# Patient Record
Sex: Male | Born: 1945 | Race: White | Hispanic: No | Smoking: Never smoker
Health system: Southern US, Community
[De-identification: ages and names within clinical notes are randomized; demographics above are authoritative.]

## PROBLEM LIST (undated history)

## (undated) DIAGNOSIS — I10 Essential (primary) hypertension: Secondary | ICD-10-CM

## (undated) DIAGNOSIS — I2510 Atherosclerotic heart disease of native coronary artery without angina pectoris: Secondary | ICD-10-CM

## (undated) DIAGNOSIS — I2119 ST elevation (STEMI) myocardial infarction involving other coronary artery of inferior wall: Secondary | ICD-10-CM

## (undated) DIAGNOSIS — E785 Hyperlipidemia, unspecified: Secondary | ICD-10-CM

## (undated) HISTORY — DX: Hyperlipidemia, unspecified: E78.5

---

## 1994-09-20 HISTORY — PX: CARDIAC SURGERY: SHX584

## 2010-05-03 ENCOUNTER — Emergency Department (HOSPITAL_COMMUNITY): Admission: EM | Admit: 2010-05-03 | Discharge: 2010-05-03 | Payer: Self-pay | Admitting: Emergency Medicine

## 2010-12-04 LAB — ETHANOL: Alcohol, Ethyl (B): 102 mg/dL — ABNORMAL HIGH (ref 0–10)

## 2011-09-29 IMAGING — CT CT MAXILLOFACIAL W/O CM
4 of 9 series · 15 of 47 positions shown, 17 images · non-contrast
Comparison: None.

CT HEAD

CLINICAL DATA: 64-year-old male status post fall, given the pool,
head and face injury with pain.

CT HEAD WITHOUT CONTRAST
CT MAXILLOFACIAL WITHOUT CONTRAST
CT CERVICAL SPINE WITHOUT CONTRAST
TECHNIQUE: Multidetector CT imaging of the head, cervical spine,
and maxillofacial structures were performed using the standard
protocol without intravenous contrast. Multiplanar CT image
reconstructions of the cervical spine and maxillofacial structures
were also generated.

[Series 3: recon 2: brain · axial · 0.49mm/px · 1 of 72 slices shown]
[im 15/72  bone]
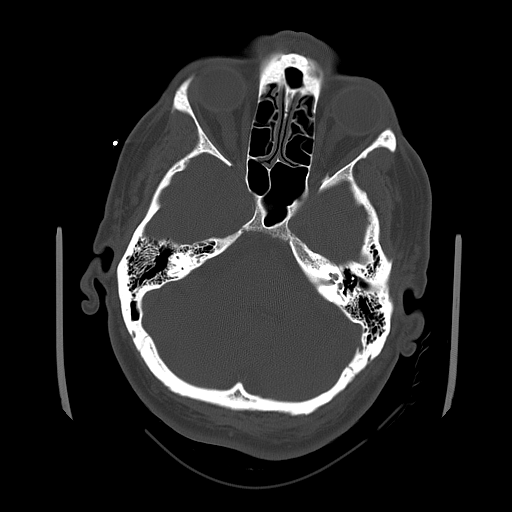

[Series 8: recon 2: cervical spine · axial · 0.35mm/px · z∈[-366,-221]mm · 5 of 88 slices shown]
[im 15/88  bone]
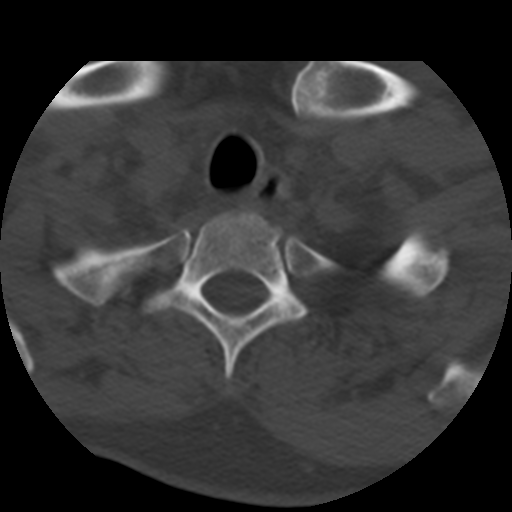
[im 30/88  bone]
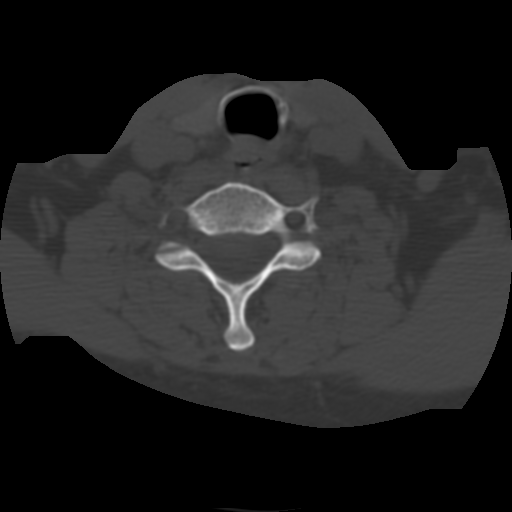
[im 44/88  bone]
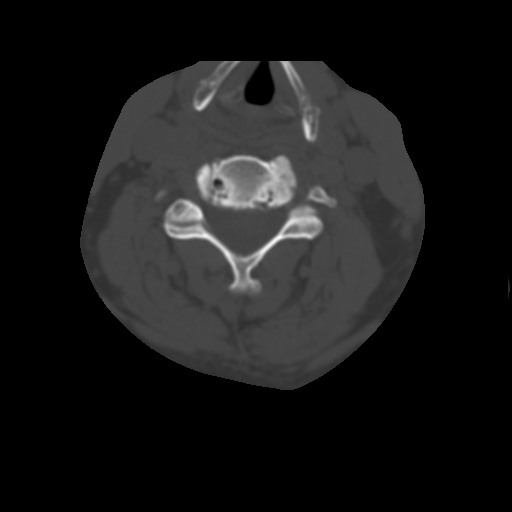
[im 59/88  bone]
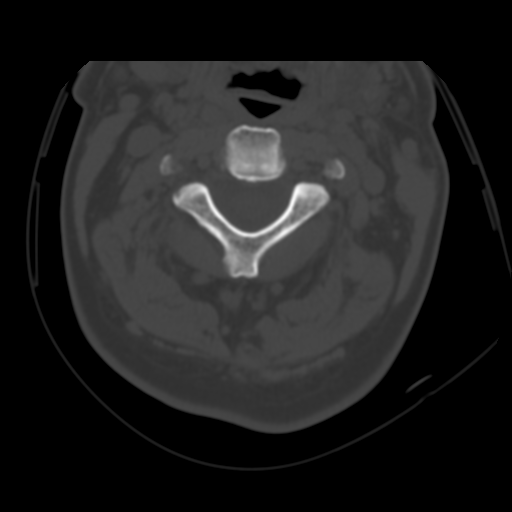
[im 73/88  bone]
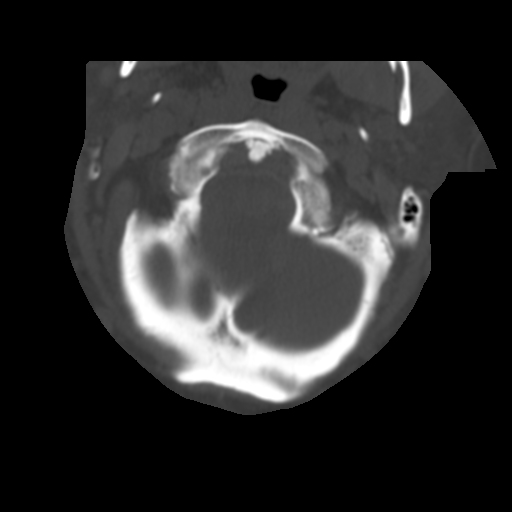

[Series 601: reformatted · sagittal · 0.44mm/px · 3 of 89 slices shown (1 of 2)]
[im 23/89  bone]
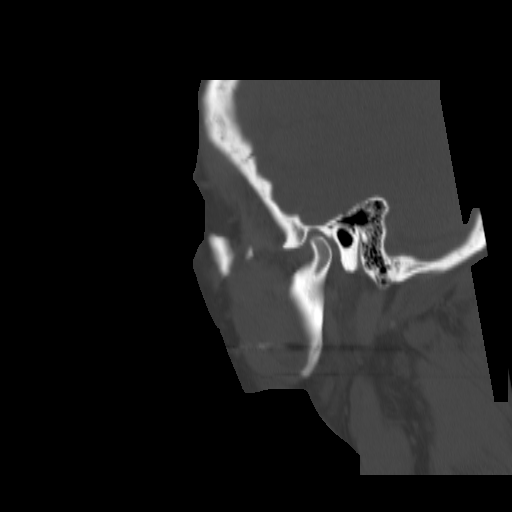
[im 45/89  bone]
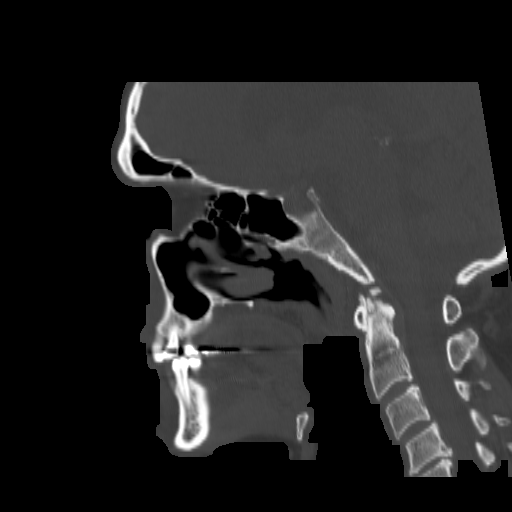
[im 67/89  bone]
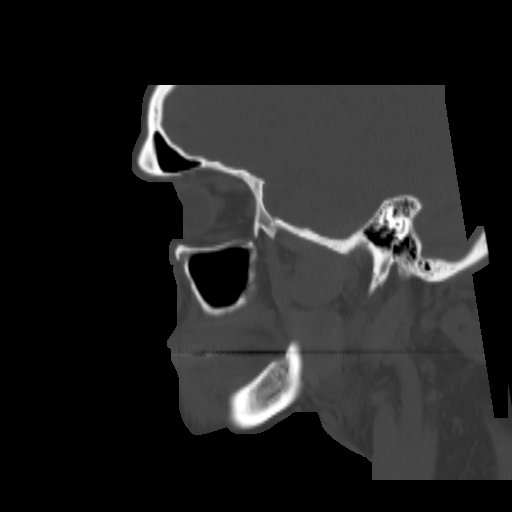

[Series 902: reformatted · axial · 0.44mm/px · z∈[-379,-246]mm · 6 of 100 slices shown, 8 images (2 of 2)]
[im 15/100  brain]
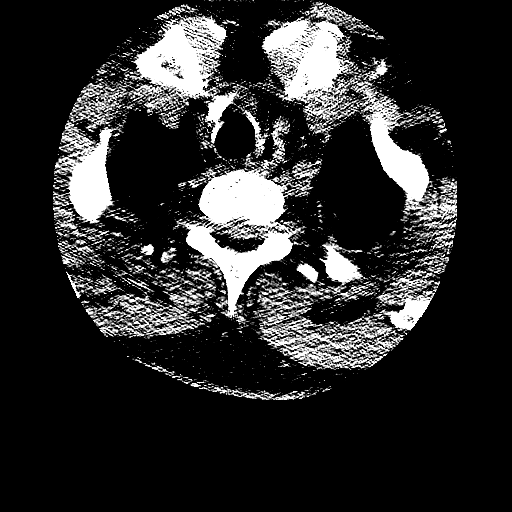
[im 15/100  bone]
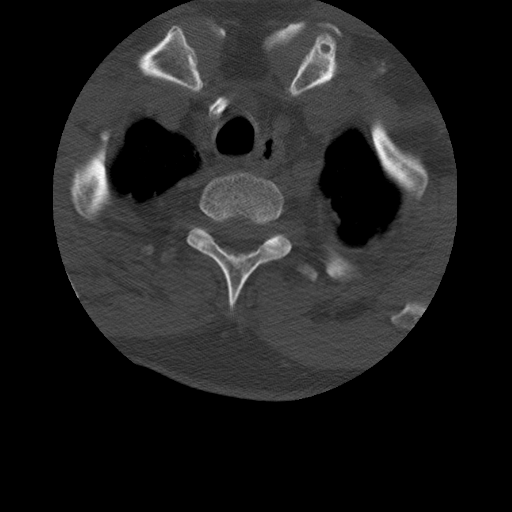
[im 29/100  bone]
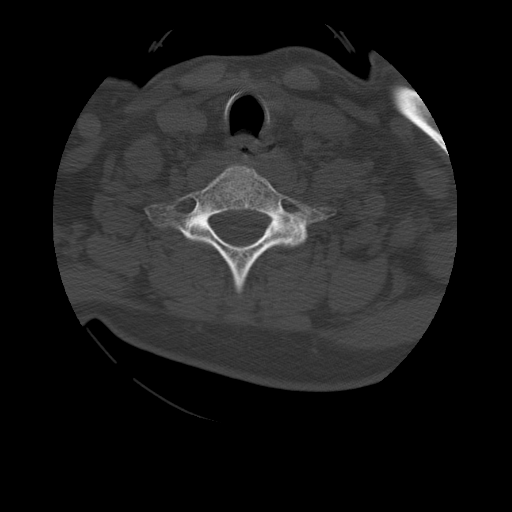
[im 43/100  bone]
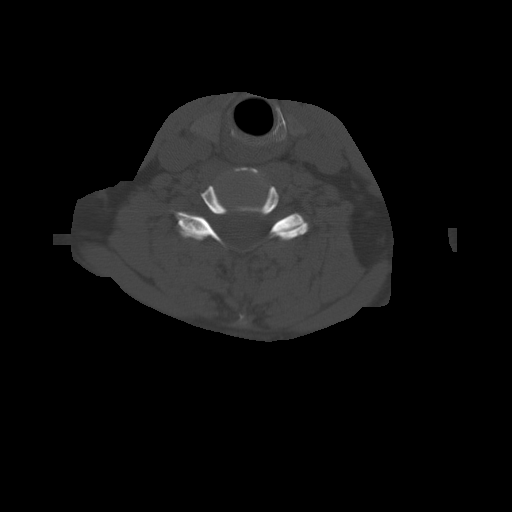
[im 57/100  bone]
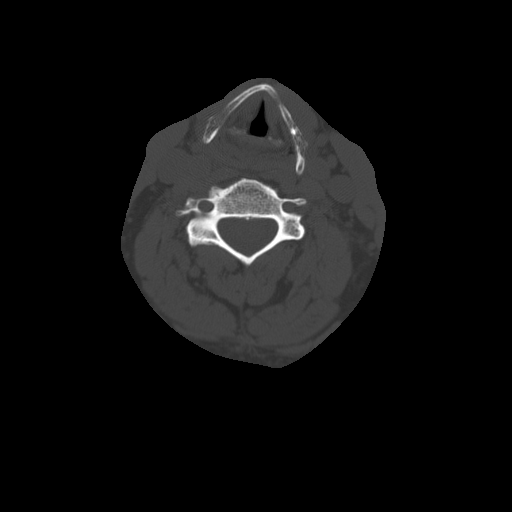
[im 71/100  brain]
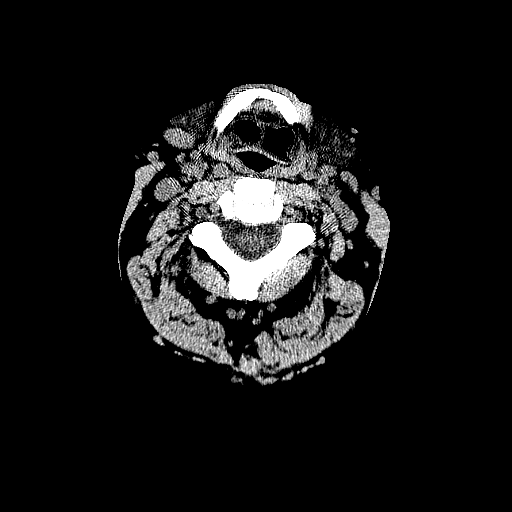
[im 71/100  bone]
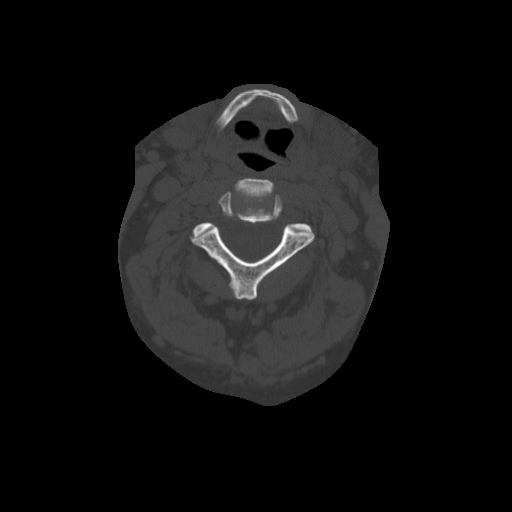
[im 85/100  bone]
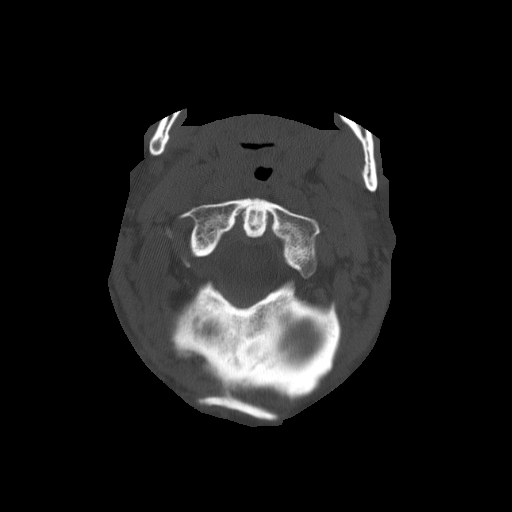

[15 of 47 positions shown; findings below may reference images not displayed]

FINDINGS: Multi focal anterior scalp laceration with trace
subcutaneous gas.  Subcutaneous fat contusion without confluent
scalp hematoma.  Facial findings are below.

Mastoids are clear.  Frontal sinuses are clear.  A small arachnoid
granulations noted along the inner table of the anterior skull.  No
acute to calvarium fracture identified.

Calcified atherosclerosis at the skull base.  Mild ventricular
prominence without overt ventriculomegaly. No midline shift, mass
effect, or evidence of mass lesion.  No acute intracranial
hemorrhage identified.  No evidence of cortically based acute
infarction identified.
IMPRESSION: 1.  Anterior scalp soft tissue injury without underlying calvarium
fracture.  Facial findings are below.
2.  No acute traumatic injury to the brain.

CT MAXILLOFACIAL
FINDINGS: Visualized deep soft tissue spaces of the face and neck
are within normal limits.  Globes and intraorbital soft tissues
appear within normal limits. Depressed comminuted bilateral nasal
bone fractures including 88 more nondisplaced fracture of the
frontal process of the right maxilla.  Rightward deviation of the
nasal septum.  There may be hemorrhage into the cartilaginous
septum (series 4 image 42 (series 5 image 42).  The despite these
findings, paranasal sinuses are clear.  Postoperative changes to
the body of the right mandible.  The mandible appears intact.
Anterior facial and nasal bridge soft tissue contusions and
lacerations.  Degenerative changes in the cervical spine, cervical
findings are described below.
IMPRESSION: 1.  Comminuted and depressed bilateral nasal bone fractures.
Rightward deviation of the nasal septum.  Possible hemorrhage into
the cartilaginous portion of the nasal septum.
2.  No additional acute facial fracture.  Cervical findings are
below.

CT CERVICAL SPINE
FINDINGS: Straightening of cervical lordosis.  Extensive
degenerative changes at the anterior C1-C2 articulation.
Cervicothoracic junction alignment is within normal limits.
Bilateral posterior element alignment is within normal limits.
Trace anterolisthesis of C3 on C4 associated with severe facet
degeneration on the right.  Chronic C4-C5 disc degeneration.
Calcified atherosclerosis of the carotid arteries in the neck.
Visualized lung apices are clear aside from mild scarring.
Visualized skull base is intact.  No atlanto-occipital
dissociation.  No acute cervical fracture.
IMPRESSION: 1. No acute fracture or listhesis identified in the cervical spine.
Ligamentous injury is not excluded.
2.  Multilevel degenerative changes including chronic-appearing
trace spondylolisthesis at C3-C4.

## 2017-07-19 ENCOUNTER — Inpatient Hospital Stay (HOSPITAL_COMMUNITY)
Admission: EM | Admit: 2017-07-19 | Discharge: 2017-07-21 | DRG: 247 | Disposition: A | Discharge status: Home / Self Care | Payer: Medicare PPO | Attending: Cardiovascular Disease | Admitting: Cardiovascular Disease

## 2017-07-19 ENCOUNTER — Encounter (HOSPITAL_COMMUNITY)
Admission: EM | Disposition: A | Discharge status: Home / Self Care | Payer: Self-pay | Source: Home / Self Care | Attending: Cardiovascular Disease

## 2017-07-19 ENCOUNTER — Encounter (HOSPITAL_COMMUNITY): Payer: Self-pay

## 2017-07-19 DIAGNOSIS — I2111 ST elevation (STEMI) myocardial infarction involving right coronary artery: Secondary | ICD-10-CM | POA: Diagnosis present

## 2017-07-19 DIAGNOSIS — R0789 Other chest pain: Secondary | ICD-10-CM | POA: Diagnosis present

## 2017-07-19 DIAGNOSIS — I21A9 Other myocardial infarction type: Secondary | ICD-10-CM | POA: Diagnosis present

## 2017-07-19 DIAGNOSIS — I219 Acute myocardial infarction, unspecified: Secondary | ICD-10-CM | POA: Diagnosis not present

## 2017-07-19 DIAGNOSIS — I10 Essential (primary) hypertension: Secondary | ICD-10-CM | POA: Diagnosis present

## 2017-07-19 DIAGNOSIS — I252 Old myocardial infarction: Secondary | ICD-10-CM

## 2017-07-19 DIAGNOSIS — E785 Hyperlipidemia, unspecified: Secondary | ICD-10-CM | POA: Diagnosis present

## 2017-07-19 DIAGNOSIS — I2119 ST elevation (STEMI) myocardial infarction involving other coronary artery of inferior wall: Secondary | ICD-10-CM | POA: Diagnosis not present

## 2017-07-19 DIAGNOSIS — I213 ST elevation (STEMI) myocardial infarction of unspecified site: Secondary | ICD-10-CM

## 2017-07-19 DIAGNOSIS — T82857A Stenosis of cardiac prosthetic devices, implants and grafts, initial encounter: Secondary | ICD-10-CM | POA: Diagnosis present

## 2017-07-19 DIAGNOSIS — Y831 Surgical operation with implant of artificial internal device as the cause of abnormal reaction of the patient, or of later complication, without mention of misadventure at the time of the procedure: Secondary | ICD-10-CM | POA: Diagnosis present

## 2017-07-19 DIAGNOSIS — I2510 Atherosclerotic heart disease of native coronary artery without angina pectoris: Secondary | ICD-10-CM | POA: Diagnosis not present

## 2017-07-19 DIAGNOSIS — Z955 Presence of coronary angioplasty implant and graft: Secondary | ICD-10-CM

## 2017-07-19 HISTORY — DX: Essential (primary) hypertension: I10

## 2017-07-19 HISTORY — PX: LEFT HEART CATH AND CORONARY ANGIOGRAPHY: CATH118249

## 2017-07-19 HISTORY — PX: CORONARY/GRAFT ACUTE MI REVASCULARIZATION: CATH118305

## 2017-07-19 HISTORY — DX: Atherosclerotic heart disease of native coronary artery without angina pectoris: I25.10

## 2017-07-19 HISTORY — DX: ST elevation (STEMI) myocardial infarction involving other coronary artery of inferior wall: I21.19

## 2017-07-19 LAB — CBC
HEMATOCRIT: 36.7 % — AB (ref 39.0–52.0)
HEMOGLOBIN: 12.5 g/dL — AB (ref 13.0–17.0)
MCH: 32.1 pg (ref 26.0–34.0)
MCHC: 34.1 g/dL (ref 30.0–36.0)
MCV: 94.1 fL (ref 78.0–100.0)
Platelets: 184 10*3/uL (ref 150–400)
RBC: 3.9 MIL/uL — AB (ref 4.22–5.81)
RDW: 12.7 % (ref 11.5–15.5)
WBC: 5.5 10*3/uL (ref 4.0–10.5)

## 2017-07-19 LAB — LIPID PANEL
CHOL/HDL RATIO: 3.6 ratio
CHOLESTEROL: 145 mg/dL (ref 0–200)
HDL: 40 mg/dL — ABNORMAL LOW (ref >40)
LDL Cholesterol: 79 mg/dL (ref 0–99)
Triglycerides: 131 mg/dL (ref <150)
VLDL: 26 mg/dL (ref 0–40)

## 2017-07-19 LAB — COMPREHENSIVE METABOLIC PANEL
ALT: 27 U/L (ref 17–63)
ANION GAP: 10 (ref 5–15)
AST: 22 U/L (ref 15–41)
Albumin: 3.7 g/dL (ref 3.5–5.0)
Alkaline Phosphatase: 52 U/L (ref 38–126)
BILIRUBIN TOTAL: 0.5 mg/dL (ref 0.3–1.2)
BUN: 8 mg/dL (ref 6–20)
CALCIUM: 8.3 mg/dL — AB (ref 8.9–10.3)
CO2: 23 mmol/L (ref 22–32)
Chloride: 104 mmol/L (ref 101–111)
Creatinine, Ser: 0.84 mg/dL (ref 0.61–1.24)
GFR calc Af Amer: >60 mL/min (ref >60)
Glucose, Bld: 99 mg/dL (ref 65–99)
POTASSIUM: 3.1 mmol/L — AB (ref 3.5–5.1)
Sodium: 137 mmol/L (ref 135–145)
TOTAL PROTEIN: 6.2 g/dL — AB (ref 6.5–8.1)

## 2017-07-19 LAB — HEMOGLOBIN A1C
Hgb A1c MFr Bld: 5.3 % (ref 4.8–5.6)
MEAN PLASMA GLUCOSE: 105.41 mg/dL

## 2017-07-19 LAB — APTT: APTT: 123 s — AB (ref 24–36)

## 2017-07-19 LAB — TROPONIN I: TROPONIN I: 0.05 ng/mL — AB (ref <0.03)

## 2017-07-19 LAB — PROTIME-INR
INR: 1.14
Prothrombin Time: 14.5 seconds (ref 11.4–15.2)

## 2017-07-19 SURGERY — LEFT HEART CATH AND CORONARY ANGIOGRAPHY
Anesthesia: LOCAL

## 2017-07-19 MED ORDER — ONDANSETRON HCL 4 MG/2ML IJ SOLN: 4.0000 mg | Freq: Four times a day (QID) | INTRAMUSCULAR | Status: DC | PRN

## 2017-07-19 MED ORDER — LIDOCAINE HCL 2 % IJ SOLN
INTRAMUSCULAR | Status: AC
  Filled 2017-07-19: qty 20

## 2017-07-19 MED ORDER — SODIUM CHLORIDE 0.9% FLUSH: 3.0000 mL | INTRAVENOUS | Status: DC | PRN

## 2017-07-19 MED ORDER — OXYCODONE HCL 5 MG PO TABS: 5.0000 mg | ORAL_TABLET | ORAL | Status: DC | PRN

## 2017-07-19 MED ORDER — TIROFIBAN HCL IN NACL 5-0.9 MG/100ML-% IV SOLN
INTRAVENOUS | Status: AC
  Filled 2017-07-19: qty 100

## 2017-07-19 MED ORDER — HEPARIN SODIUM (PORCINE) 1000 UNIT/ML IJ SOLN
INTRAMUSCULAR | Status: DC | PRN
  Administered 2017-07-19: 2000 [IU] via INTRAVENOUS
  Administered 2017-07-19: 6000 [IU] via INTRAVENOUS

## 2017-07-19 MED ORDER — IOPAMIDOL (ISOVUE-370) INJECTION 76%
INTRAVENOUS | Status: AC
  Filled 2017-07-19: qty 125

## 2017-07-19 MED ORDER — TRAZODONE HCL 50 MG PO TABS: 50.0000 mg | ORAL_TABLET | Freq: Every evening | ORAL | Status: DC | PRN

## 2017-07-19 MED ORDER — ENOXAPARIN SODIUM 40 MG/0.4ML ~~LOC~~ SOLN
40.0000 mg | SUBCUTANEOUS | Status: DC
  Administered 2017-07-20: 40 mg via SUBCUTANEOUS
  Filled 2017-07-19: qty 0.4

## 2017-07-19 MED ORDER — HYDRALAZINE HCL 20 MG/ML IJ SOLN: 5.0000 mg | INTRAMUSCULAR | Status: AC | PRN

## 2017-07-19 MED ORDER — HEPARIN SODIUM (PORCINE) 1000 UNIT/ML IJ SOLN
INTRAMUSCULAR | Status: AC
  Filled 2017-07-19: qty 1

## 2017-07-19 MED ORDER — HEPARIN SODIUM (PORCINE) 5000 UNIT/ML IJ SOLN
INTRAMUSCULAR | Status: AC
  Filled 2017-07-19: qty 1

## 2017-07-19 MED ORDER — METOPROLOL TARTRATE 50 MG PO TABS
50.0000 mg | ORAL_TABLET | Freq: Two times a day (BID) | ORAL | Status: DC
  Administered 2017-07-19 – 2017-07-21 (×4): 50 mg via ORAL
  Filled 2017-07-19 (×4): qty 1

## 2017-07-19 MED ORDER — TICAGRELOR 90 MG PO TABS
ORAL_TABLET | ORAL | Status: DC | PRN
  Administered 2017-07-19: 180 mg via ORAL

## 2017-07-19 MED ORDER — ACETAMINOPHEN 325 MG PO TABS
650.0000 mg | ORAL_TABLET | ORAL | Status: DC | PRN
  Administered 2017-07-20 (×2): 650 mg via ORAL
  Filled 2017-07-19 (×2): qty 2

## 2017-07-19 MED ORDER — HEPARIN (PORCINE) IN NACL 2-0.9 UNIT/ML-% IJ SOLN
INTRAMUSCULAR | Status: AC | PRN
  Administered 2017-07-19: 1000 mL via INTRA_ARTERIAL

## 2017-07-19 MED ORDER — TICAGRELOR 90 MG PO TABS
ORAL_TABLET | ORAL | Status: AC
  Filled 2017-07-19: qty 2

## 2017-07-19 MED ORDER — MIDAZOLAM HCL 2 MG/2ML IJ SOLN
INTRAMUSCULAR | Status: DC | PRN
  Administered 2017-07-19: 2 mg via INTRAVENOUS

## 2017-07-19 MED ORDER — TIROFIBAN HCL IN NACL 5-0.9 MG/100ML-% IV SOLN
INTRAVENOUS | Status: AC | PRN
  Administered 2017-07-19: 0.15 ug/kg/min via INTRAVENOUS

## 2017-07-19 MED ORDER — NITROGLYCERIN 0.4 MG SL SUBL: 0.4000 mg | SUBLINGUAL_TABLET | SUBLINGUAL | Status: DC | PRN

## 2017-07-19 MED ORDER — HEPARIN SODIUM (PORCINE) 5000 UNIT/ML IJ SOLN
4000.0000 [IU] | Freq: Once | INTRAMUSCULAR | Status: AC
  Administered 2017-07-19: 4000 [IU] via INTRAVENOUS

## 2017-07-19 MED ORDER — NITROGLYCERIN 1 MG/10 ML FOR IR/CATH LAB
INTRA_ARTERIAL | Status: DC | PRN
  Administered 2017-07-19: 200 ug via INTRACORONARY

## 2017-07-19 MED ORDER — SERTRALINE HCL 50 MG PO TABS
50.0000 mg | ORAL_TABLET | Freq: Every morning | ORAL | Status: DC
  Administered 2017-07-21: 50 mg via ORAL
  Filled 2017-07-19 (×2): qty 1

## 2017-07-19 MED ORDER — IOPAMIDOL (ISOVUE-370) INJECTION 76%
INTRAVENOUS | Status: DC | PRN
  Administered 2017-07-19: 105 mL via INTRA_ARTERIAL

## 2017-07-19 MED ORDER — HEPARIN (PORCINE) IN NACL 2-0.9 UNIT/ML-% IJ SOLN
INTRAMUSCULAR | Status: AC
  Filled 2017-07-19: qty 1000

## 2017-07-19 MED ORDER — FENTANYL CITRATE (PF) 100 MCG/2ML IJ SOLN
INTRAMUSCULAR | Status: DC | PRN
  Administered 2017-07-19: 25 ug via INTRAVENOUS

## 2017-07-19 MED ORDER — SODIUM CHLORIDE 0.9 % WEIGHT BASED INFUSION: 1.0000 mL/kg/h | INTRAVENOUS | Status: AC

## 2017-07-19 MED ORDER — TICAGRELOR 90 MG PO TABS
90.0000 mg | ORAL_TABLET | Freq: Two times a day (BID) | ORAL | Status: DC
  Administered 2017-07-20 – 2017-07-21 (×3): 90 mg via ORAL
  Filled 2017-07-19 (×3): qty 1

## 2017-07-19 MED ORDER — SODIUM CHLORIDE 0.9 % IV SOLN
INTRAVENOUS | Status: AC | PRN
  Administered 2017-07-19: 100 mL/h via INTRAVENOUS

## 2017-07-19 MED ORDER — LABETALOL HCL 5 MG/ML IV SOLN: 10.0000 mg | INTRAVENOUS | Status: AC | PRN

## 2017-07-19 MED ORDER — MIDAZOLAM HCL 2 MG/2ML IJ SOLN
INTRAMUSCULAR | Status: AC
  Filled 2017-07-19: qty 2

## 2017-07-19 MED ORDER — ROPINIROLE HCL 1 MG PO TABS
0.5000 mg | ORAL_TABLET | Freq: Every day | ORAL | Status: DC
  Administered 2017-07-19 – 2017-07-20 (×2): 0.5 mg via ORAL
  Filled 2017-07-19 (×2): qty 1

## 2017-07-19 MED ORDER — MORPHINE SULFATE (PF) 4 MG/ML IV SOLN: 2.0000 mg | INTRAVENOUS | Status: DC | PRN

## 2017-07-19 MED ORDER — ATORVASTATIN CALCIUM 80 MG PO TABS
80.0000 mg | ORAL_TABLET | Freq: Every day | ORAL | Status: DC
  Administered 2017-07-20: 80 mg via ORAL
  Filled 2017-07-19: qty 1

## 2017-07-19 MED ORDER — FENTANYL CITRATE (PF) 100 MCG/2ML IJ SOLN
INTRAMUSCULAR | Status: AC
  Filled 2017-07-19: qty 2

## 2017-07-19 MED ORDER — LIDOCAINE HCL 2 % IJ SOLN
INTRAMUSCULAR | Status: DC | PRN
  Administered 2017-07-19: 3 mL via INTRADERMAL

## 2017-07-19 MED ORDER — SODIUM CHLORIDE 0.9 % IV SOLN: 250.0000 mL | INTRAVENOUS | Status: DC | PRN

## 2017-07-19 MED ORDER — ASPIRIN EC 81 MG PO TBEC
81.0000 mg | DELAYED_RELEASE_TABLET | Freq: Every day | ORAL | Status: DC
  Administered 2017-07-20 – 2017-07-21 (×2): 81 mg via ORAL
  Filled 2017-07-19 (×2): qty 1

## 2017-07-19 MED ORDER — SODIUM CHLORIDE 0.9% FLUSH
3.0000 mL | Freq: Two times a day (BID) | INTRAVENOUS | Status: DC
  Administered 2017-07-20: 3 mL via INTRAVENOUS

## 2017-07-19 MED ORDER — TIROFIBAN (AGGRASTAT) BOLUS VIA INFUSION
INTRAVENOUS | Status: DC | PRN
  Administered 2017-07-19: 2437.5 ug via INTRAVENOUS

## 2017-07-19 MED ORDER — VERAPAMIL HCL 2.5 MG/ML IV SOLN
INTRAVENOUS | Status: AC
  Filled 2017-07-19: qty 2

## 2017-07-19 MED ORDER — VERAPAMIL HCL 2.5 MG/ML IV SOLN
INTRAVENOUS | Status: DC | PRN
  Administered 2017-07-19: 10 mL via INTRA_ARTERIAL

## 2017-07-19 SURGICAL SUPPLY — 18 items
BALLN EUPHORA RX 2.5X15 (BALLOONS) ×2
BALLN ~~LOC~~ EUPHORA RX 4.5X12 (BALLOONS) ×2
BALLOON EUPHORA RX 2.5X15 (BALLOONS) IMPLANT
BALLOON ~~LOC~~ EUPHORA RX 4.5X12 (BALLOONS) IMPLANT
CATH 5FR JL3.5 JR4 ANG PIG MP (CATHETERS) ×1 IMPLANT
CATH VISTA GUIDE 6FR JR4 (CATHETERS) ×1 IMPLANT
DEVICE RAD COMP TR BAND LRG (VASCULAR PRODUCTS) ×1 IMPLANT
GLIDESHEATH SLEND SS 6F .021 (SHEATH) ×1 IMPLANT
GUIDEWIRE INQWIRE 1.5J.035X260 (WIRE) IMPLANT
INQWIRE 1.5J .035X260CM (WIRE) ×2
KIT ENCORE 26 ADVANTAGE (KITS) ×2 IMPLANT
KIT HEART LEFT (KITS) ×2 IMPLANT
PACK CARDIAC CATHETERIZATION (CUSTOM PROCEDURE TRAY) ×2 IMPLANT
STENT SIERRA 4.00 X 23 MM (Permanent Stent) ×1 IMPLANT
SYR MEDRAD MARK V 150ML (SYRINGE) ×2 IMPLANT
TRANSDUCER W/STOPCOCK (MISCELLANEOUS) ×2 IMPLANT
TUBING CIL FLEX 10 FLL-RA (TUBING) ×2 IMPLANT
WIRE COUGAR XT STRL 190CM (WIRE) ×1 IMPLANT

## 2017-07-19 NOTE — H&P (Signed)
Patient ID: Jason Cabrera MRN: [id number], DOB/AGE: 1946-07-20   Admit date: 07/19/2017   Primary Physician: No primary care provider on file. Primary Cardiologist: None  Pt. Profile: 71 yo male w/ history of CAD (PCI to RCA in 1996) who presents with acute onset of chest pain and STEMI on EKG  Problem List  Past Medical History:  Diagnosis Date  . 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 Coronary artery disease   . Hypertension     Past Surgical History:  Procedure Laterality Date  . CARDIAC SURGERY  1996   stents     Allergies  No Known Allergies  HPI Melton Krebs was in his usual state of health when this evening while sitting and talking with his girlfriend he developed chest pressure/tighthness. It radiated to his left jaw and left shoulder. Denied nausea, vomiting or diaphoresis - something he had with his prior MI.   Denies any tobacco, drugs or ETOH. Does not take his ASA regularly.   Home Medications  Prior to Admission medications   Medication Sig Start Date End Date Taking? Authorizing Provider  aspirin EC 81 MG tablet Take 81 mg by mouth daily.   Yes [provider]  hydrOXYzine (VISTARIL) 25 MG capsule Take 25 mg by mouth daily as needed for anxiety.   Yes [provider]  metoprolol tartrate (LOPRESSOR) 50 MG tablet Take 50 mg by mouth 2 (two) times daily.   Yes [provider]  rOPINIRole (REQUIP) 0.5 MG tablet Take 0.5 mg by mouth at bedtime.   Yes [provider]  sertraline (ZOLOFT) 100 MG tablet Take 50 mg by mouth every morning.   Yes [provider]  tadalafil (CIALIS) 10 MG tablet Take 10 mg by mouth daily as needed for erectile dysfunction.   Yes [provider]  traZODone (DESYREL) 50 MG tablet Take 50 mg by mouth at bedtime as needed for sleep.   Yes [provider]    Family History  No family history on file.  Social History  Social History    Social History  . Marital status: Divorced    Spouse name: N/A  . Number of children: N/A  . Years of education: N/A   Occupational History  . Not on file.   Social History Main Topics  . Smoking status: Never Smoker  . Smokeless tobacco: Never Used  . Alcohol use No  . Drug use: No  . Sexual activity: Not on file   Other Topics Concern  . Not on file   Social History Narrative  . No narrative on file     Review of Systems General:  No chills, fever, night sweats or weight changes.  Cardiovascular:  + chest pain, no dyspnea on exertion, edema, orthopnea, palpitations, paroxysmal nocturnal dyspnea. Dermatological: No rash, lesions/masses Respiratory: No cough, dyspnea Urologic: No hematuria, dysuria Abdominal:   No nausea, vomiting, diarrhea, bright red blood per rectum, melena, or hematemesis Neurologic:  No visual changes, wkns, changes in mental status. All other systems reviewed and are otherwise negative except as noted above.  Physical Exam  Blood pressure (!) 141/73, pulse 75, temperature 98.6 F (37 C), temperature source Oral, resp. rate 15, height 6\' 1"  (1.854 m), weight 97.5 kg (215 lb), SpO2 99 %.  General: Pleasant, NAD Psych: Normal affect. Neuro: Alert and oriented X 3. Moves all extremities spontaneously. HEENT: Normal  Neck: Supple without bruits or JVD. Lungs:  Resp regular and unlabored, CTA. Heart: RRR no  s3, s4, or murmurs. Abdomen: Soft, non-tender, non-distended, BS + x 4.  Extremities: No clubbing, cyanosis or edema. DP/PT/Radials 2+ and equal bilaterally.  Labs  Troponin (Point of Care Test) No results for input(s): TROPIPOC in the last 72 hours.  Recent Labs  07/19/17 2142  TROPONINI 0.05*   Lab Results  Component Value Date   WBC 5.5 07/19/2017   HGB 12.5 (L) 07/19/2017   HCT 36.7 (L) 07/19/2017   MCV 94.1 07/19/2017   PLT 184 07/19/2017    Recent Labs Lab 07/19/17 2142  NA 137  K 3.1*  CL 104  CO2 23  BUN 8   CREATININE 0.84  CALCIUM 8.3*  PROT 6.2*  BILITOT 0.5  ALKPHOS 52  ALT 27  AST 22  GLUCOSE 99   Lab Results  Component Value Date   CHOL 145 07/19/2017   HDL 40 (L) 07/19/2017   LDLCALC 79 07/19/2017   TRIG 131 07/19/2017   No results found for: DDIMER   Radiology/Studies  No results found.  ECG Showed inferior/posterior ST elevation   Echocardiogram  Pending   LHC 1.  Acute inferoposterior MI secondary to critical mid RCA in-stent restenosis/thrombosis, treated successfully with PCI using a 4.0 x 23 mm Sierra DES 2.  Mild diffuse nonobstructive coronary artery disease as detailed above 3.  Mild segmental contraction abnormality the left ventricle with normal LVEDP and preserved LVEF  ASSESSMENT AND PLAN 71 yo male w/ history of CAD (PCI to RCA in 1996) who presents with acute onset of chest pain and inferior STEMI on EKG  # Acute inferoposterior STEMI: underwent LHC, found to have critical mRCA in-stent restenosis/thrombosis with successful PCI w/ DES - Tirofiban x 4 hours - DAPT with ASA and brilinta x 12 months (180 mg loading dose administered in cath lab) - Atorvastatin 80mg , Metop 50mg  BID and aggressive risk reduction measures - TTE - Consider discharge in 48 hours if no complications arise  Signed, Yehuda SavannahJedrek Lenaya Pietsch, MD

## 2017-07-19 NOTE — ED Triage Notes (Signed)
Pt arrived as code stemi via [facility] after 1 hour of sudden onset left chest tightness, left shoulder pain, and left arm numbness. Pt states he felt fatigued since pain began. PT took 6 81mg  ASA pta. 1 sl ntg given pta. Pt a&Ox 4. NAD. Denies n/v/ diaphoresis

## 2017-07-19 NOTE — ED Notes (Signed)
Pt to cathlab 5 at this time

## 2017-07-19 NOTE — ED Notes (Signed)
Pt reports pain increasing to 7/10 at this time. Dr. Corlis Leak aware

## 2017-07-19 NOTE — ED Provider Notes (Signed)
[facility] Provider Note   CSN: [id number] Arrival date & time: 07/19/17  2028     History   Chief Complaint Chief Complaint  Patient presents with  . Code STEMI    HPI Jason Cabrera is a 71 y.o. male.  HPI   Patient is a 71 year old male presenting with chest pain.  Patient had stent placed in 1996.  Patient reports that he was talking to his girlfriend and also developed heaviness in his chest similar to his last STEMI.  Patient took 6 aspirin began to feel a bit better EMS arrived EKG showed ST elevations in 2 3 and aVF.  A called code STEMI.  On arrival EKG changes somewhat better, chest pain has been relieved.  Will start heparin bolus and drip.  Cardiology at bedside.    Past Medical History:  Diagnosis Date  . 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 Coronary artery disease   . Hypertension     Patient Active Problem List   Diagnosis Date Noted  . 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 STEMI involving right coronary artery (HCC) 07/19/2017    Past Surgical History:  Procedure Laterality Date  . CARDIAC SURGERY  1996   stents       Home Medications    Prior to Admission medications   Medication Sig Start Date End Date Taking? Authorizing Provider  aspirin EC 81 MG tablet Take 81 mg by mouth daily.   Yes [provider]  hydrOXYzine (VISTARIL) 25 MG capsule Take 25 mg by mouth daily as needed for anxiety.   Yes [provider]  metoprolol tartrate (LOPRESSOR) 50 MG tablet Take 50 mg by mouth 2 (two) times daily.   Yes [provider]  rOPINIRole (REQUIP) 0.5 MG tablet Take 0.5 mg by mouth at bedtime.   Yes [provider]  sertraline (ZOLOFT) 100 MG tablet Take 50 mg by mouth every morning.   Yes [provider]  tadalafil (CIALIS) 10 MG tablet Take 10 mg by mouth daily as needed for erectile dysfunction.   Yes [provider]    traZODone (DESYREL) 50 MG tablet Take 50 mg by mouth at bedtime as needed for sleep.   Yes [provider]    Family History No family history on file.  Social History Social History  Substance Use Topics  . Smoking status: Never Smoker  . Smokeless tobacco: Never Used  . Alcohol use No     Allergies   Patient has no known allergies.   Review of Systems Review of Systems  Constitutional: Negative for activity change.  Respiratory: Positive for shortness of breath.   Cardiovascular: Positive for chest pain.  Gastrointestinal: Negative for abdominal pain.  All other systems reviewed and are negative.    Physical Exam Updated Vital Signs BP (!) 141/73   Pulse 75   Temp 98.6 F (37 C) (Oral)   Resp 15   Ht 6\' 1"  (1.854 m)   Wt 97.5 kg (215 lb)   SpO2 99%   BMI 28.37 kg/m   Physical Exam  Constitutional: He is oriented to person, place, and time. He appears well-nourished.  HENT:  Head: Normocephalic.  Eyes: Conjunctivae are normal.  Cardiovascular: Normal rate and regular rhythm.   No murmur heard. Pulmonary/Chest: Effort normal and breath sounds normal. No respiratory distress.  Abdominal: Soft. He exhibits no distension. There is no tenderness.  Neurological: He is oriented to person, place, and time.  Skin: Skin is warm and dry. He is not diaphoretic.  Psychiatric: He has a normal mood and affect. His behavior is normal.     ED Treatments / Results  Labs (all labs ordered are listed, but only abnormal results are displayed) Labs Reviewed  COMPREHENSIVE METABOLIC PANEL - Abnormal; Notable for the following:       Result Value   Potassium 3.1 (*)    Calcium 8.3 (*)    Total Protein 6.2 (*)    All other components within normal limits  LIPID PANEL - Abnormal; Notable for the following:    HDL 40 (*)    All other components within normal limits  CBC - Abnormal; Notable for the following:    RBC 3.90 (*)    Hemoglobin 12.5 (*)    HCT 36.7  (*)    All other components within normal limits  APTT - Abnormal; Notable for the following:    aPTT 123 (*)    All other components within normal limits  TROPONIN I - Abnormal; Notable for the following:    Troponin I 0.05 (*)    All other components within normal limits  MRSA PCR SCREENING  HEMOGLOBIN A1C  PROTIME-INR  BASIC METABOLIC PANEL  CBC  LIPID PANEL    EKG  EKG Interpretation None       EKG from EMS showed greater than 3 mm ST elevation in 2 3 aVF.  EKG on arrival here shows resolution of that.  Radiology No results found.  Procedures Procedures (including critical care time)  CRITICAL CARE Performed by: Arlana Hove Total critical care time: 30 minutes Critical care time was exclusive of separately billable procedures and treating other patients. Critical care was necessary to treat or prevent imminent or life-threatening deterioration. Critical care was time spent personally by me on the following activities: development of treatment plan with patient and/or surrogate as well as nursing, discussions with consultants, evaluation of patient's response to treatment, examination of patient, obtaining history from patient or surrogate, ordering and performing treatments and interventions, ordering and review of laboratory studies, ordering and review of radiographic studies, pulse oximetry and re-evaluation of patient's condition.   Medications Ordered in ED Medications  aspirin EC tablet 81 mg (not administered)  metoprolol tartrate (LOPRESSOR) tablet 50 mg (50 mg Oral Given 07/19/17 2325)  rOPINIRole (REQUIP) tablet 0.5 mg (0.5 mg Oral Given 07/19/17 2326)  sertraline (ZOLOFT) tablet 50 mg (not administered)  traZODone (DESYREL) tablet 50 mg (not administered)  nitroGLYCERIN (NITROSTAT) SL tablet 0.4 mg (not administered)  acetaminophen (TYLENOL) tablet 650 mg (not administered)  ondansetron (ZOFRAN) injection 4 mg (not administered)  enoxaparin  (LOVENOX) injection 40 mg (not administered)  atorvastatin (LIPITOR) tablet 80 mg (not administered)  labetalol (NORMODYNE,TRANDATE) injection 10 mg (not administered)  hydrALAZINE (APRESOLINE) injection 5 mg (not administered)  0.9% sodium chloride infusion (1 mL/kg/hr  97.5 kg Intravenous Rate/Dose Change 07/19/17 2321)  sodium chloride flush (NS) 0.9 % injection 3 mL (not administered)  sodium chloride flush (NS) 0.9 % injection 3 mL (not administered)  0.9 %  sodium chloride infusion (not administered)  oxyCODONE (Oxy IR/ROXICODONE) immediate release tablet 5-10 mg (not administered)  morphine 4 MG/ML injection 2 mg (not administered)  ticagrelor (BRILINTA) tablet 90 mg (not administered)  heparin injection 4,000 Units (4,000 Units Intravenous Given 07/19/17 2046)  tirofiban (AGGRASTAT) infusion 50 mcg/mL 100 mL (  Stopped 07/19/17 2321)  0.9 %  sodium chloride infusion (100 mL/hr Intravenous New Bag/Given 07/19/17 2107)  heparin infusion 2 units/mL in 0.9 % sodium chloride (1,000 mLs Intra-arterial New Bag/Given 07/19/17 2147)     Initial Impression / Assessment and Plan / ED Course  I have reviewed the triage vital signs and the nursing notes.  Pertinent labs & imaging results that were available during my care of the patient were reviewed by me and considered in my medical decision making (see chart for details).     Patient is a 71 year old male presenting with chest pain.  Patient had stent placed in 1996.  Patient reports that he was talking to his girlfriend and also developed heaviness in his chest similar to his last STEMI.  Patient took 6 aspirin began to feel a bit better EMS arrived EKG showed ST elevations in 2 3 and aVF.  A called code STEMI.  On arrival EKG changes somewhat better, chest pain has been relieved.  Will start heparin bolus and drip.  Cardiology at bedside.    Patient taken to Cath Lab.  Final Clinical Impressions(s) / ED Diagnoses   Final diagnoses:    STEMI (ST elevation myocardial infarction) [facility])    New Prescriptions Current Discharge Medication List       Abelino Derrick, MD 07/19/17 2350

## 2017-07-19 NOTE — Progress Notes (Signed)
CRITICAL VALUE ALERT  Critical Value:  Troponin 0.05  Date & Time Notied:  2300  Provider Notified: 2300  Orders Received/Actions taken: no new orders received

## 2017-07-20 ENCOUNTER — Encounter (HOSPITAL_COMMUNITY): Payer: Self-pay | Admitting: Cardiovascular Disease

## 2017-07-20 ENCOUNTER — Inpatient Hospital Stay (HOSPITAL_COMMUNITY): Payer: Medicare PPO

## 2017-07-20 DIAGNOSIS — I2111 ST elevation (STEMI) myocardial infarction involving right coronary artery: Secondary | ICD-10-CM

## 2017-07-20 LAB — BASIC METABOLIC PANEL
ANION GAP: 6 (ref 5–15)
BUN: 9 mg/dL (ref 6–20)
CALCIUM: 8.3 mg/dL — AB (ref 8.9–10.3)
CO2: 25 mmol/L (ref 22–32)
Chloride: 107 mmol/L (ref 101–111)
Creatinine, Ser: 0.71 mg/dL (ref 0.61–1.24)
GFR calc Af Amer: >60 mL/min (ref >60)
Glucose, Bld: 112 mg/dL — ABNORMAL HIGH (ref 65–99)
POTASSIUM: 3.6 mmol/L (ref 3.5–5.1)
SODIUM: 138 mmol/L (ref 135–145)

## 2017-07-20 LAB — LIPID PANEL
Cholesterol: 143 mg/dL (ref 0–200)
HDL: 45 mg/dL (ref >40)
LDL Cholesterol: 77 mg/dL (ref 0–99)
TRIGLYCERIDES: 107 mg/dL (ref <150)
Total CHOL/HDL Ratio: 3.2 RATIO
VLDL: 21 mg/dL (ref 0–40)

## 2017-07-20 LAB — CBC
HEMATOCRIT: 37.6 % — AB (ref 39.0–52.0)
Hemoglobin: 12.7 g/dL — ABNORMAL LOW (ref 13.0–17.0)
MCH: 31.8 pg (ref 26.0–34.0)
MCHC: 33.8 g/dL (ref 30.0–36.0)
MCV: 94.2 fL (ref 78.0–100.0)
PLATELETS: 187 10*3/uL (ref 150–400)
RBC: 3.99 MIL/uL — ABNORMAL LOW (ref 4.22–5.81)
RDW: 12.6 % (ref 11.5–15.5)
WBC: 6.6 10*3/uL (ref 4.0–10.5)

## 2017-07-20 LAB — TROPONIN I: Troponin I: 0.85 ng/mL (ref <0.03)

## 2017-07-20 LAB — POCT I-STAT, CHEM 8
BUN: 11 mg/dL (ref 6–20)
CALCIUM ION: 1.17 mmol/L (ref 1.15–1.40)
CHLORIDE: 103 mmol/L (ref 101–111)
Creatinine, Ser: 0.7 mg/dL (ref 0.61–1.24)
GLUCOSE: 103 mg/dL — AB (ref 65–99)
HCT: 35 % — ABNORMAL LOW (ref 39.0–52.0)
Hemoglobin: 11.9 g/dL — ABNORMAL LOW (ref 13.0–17.0)
Potassium: 3.2 mmol/L — ABNORMAL LOW (ref 3.5–5.1)
Sodium: 140 mmol/L (ref 135–145)
TCO2: 24 mmol/L (ref 22–32)

## 2017-07-20 LAB — MRSA PCR SCREENING: MRSA by PCR: NEGATIVE

## 2017-07-20 LAB — POCT ACTIVATED CLOTTING TIME
Activated Clotting Time: 219 seconds
Activated Clotting Time: 246 seconds

## 2017-07-20 MED ORDER — LISINOPRIL 10 MG PO TABS
10.0000 mg | ORAL_TABLET | Freq: Every day | ORAL | Status: DC
  Administered 2017-07-20 – 2017-07-21 (×2): 10 mg via ORAL
  Filled 2017-07-20 (×2): qty 1

## 2017-07-20 NOTE — Progress Notes (Signed)
Progress Note  Patient Name: Jason GandyJohn W Cabrera Date of Encounter: 07/20/2017  Primary Cardiologist: New (will be Celest Reitz)  Subjective   Feels well this morning.  Denies chest pain or shortness of breath.  We reviewed his symptoms and he has been having exertional prior to his acute infarction last night.  Inpatient Medications    Scheduled Meds: . aspirin EC  81 mg Oral Daily  . atorvastatin  80 mg Oral q1800  . enoxaparin (LOVENOX) injection  40 mg Subcutaneous Q24H  . metoprolol tartrate  50 mg Oral BID  . rOPINIRole  0.5 mg Oral QHS  . sertraline  50 mg Oral q morning - 10a  . sodium chloride flush  3 mL Intravenous Q12H  . ticagrelor  90 mg Oral BID   Continuous Infusions: . sodium chloride    . sodium chloride 1 mL/kg/hr (07/19/17 2321)   PRN Meds: sodium chloride, acetaminophen, morphine injection, nitroGLYCERIN, ondansetron (ZOFRAN) IV, oxyCODONE, sodium chloride flush, traZODone   Vital Signs    Vitals:   07/20/17 0630 07/20/17 0645 07/20/17 0700 07/20/17 0715  BP: 131/67 (!) 148/68 (!) 144/67   Pulse: (!) 57 61 63 (!) 52  Resp: 13 13 17 15   Temp:      TempSrc:      SpO2: 99% 100% 100% 98%  Weight:      Height:        Intake/Output Summary (Last 24 hours) at 07/20/17 0803 Last data filed at 07/20/17 0600  Gross per 24 hour  Intake           725.52 ml  Output             1550 ml  Net          -824.48 ml   Filed Weights   07/19/17 2036  Weight: 215 lb (97.5 kg)    Telemetry    Normal sinus rhythm, no ventricular ectopy, no sustained arrhythmia- Personally Reviewed  ECG    Sinus bradycardia 53 bpm, otherwise within normal limits- Personally Reviewed  Physical Exam  Alert, oriented male in no distress GEN: No acute distress.   Neck: No JVD Cardiac: RRR, no murmurs, rubs, or gallops.  Respiratory: Clear to auscultation bilaterally. GI: Soft, nontender, non-distended  MS: No edema; No deformity.  Right radial site is clear Neuro:  Nonfocal    Psych: Normal affect   Labs    Chemistry Recent Labs Lab 07/19/17 2142 07/20/17 0203  NA 137 138  K 3.1* 3.6  CL 104 107  CO2 23 25  GLUCOSE 99 112*  BUN 8 9  CREATININE 0.84 0.71  CALCIUM 8.3* 8.3*  PROT 6.2*  --   ALBUMIN 3.7  --   AST 22  --   ALT 27  --   ALKPHOS 52  --   BILITOT 0.5  --   GFRNONAA >60 >60  GFRAA >60 >60  ANIONGAP 10 6     Hematology Recent Labs Lab 07/19/17 2142 07/20/17 0203  WBC 5.5 6.6  RBC 3.90* 3.99*  HGB 12.5* 12.7*  HCT 36.7* 37.6*  MCV 94.1 94.2  MCH 32.1 31.8  MCHC 34.1 33.8  RDW 12.7 12.6  PLT 184 187    Cardiac Enzymes Recent Labs Lab 07/19/17 2142  TROPONINI 0.05*   No results for input(s): TROPIPOC in the last 168 hours.   BNPNo results for input(s): BNP, PROBNP in the last 168 hours.   DDimer No results for input(s): DDIMER in the last 168  hours.   Radiology    No results found.  Cardiac Studies   Cardiac catheterization study 07/19/2017: Conclusion   1.  Acute inferoposterior MI secondary to critical mid RCA in-stent restenosis/thrombosis, treated successfully with PCI using a 4.0 x 23 mm Sierra DES 2.  Mild diffuse nonobstructive coronary artery disease as detailed above 3.  Mild segmental contraction abnormality the left ventricle with normal LVEDP and preserved LVEF  Recommend:  Tirofiban x 4 hours  DAPT with ASA and brilinta x 12 months (180 mg loading dose administered in cath lab)  Aggressive risk reduction measures  Consider discharge in 48 hours if no complications arise     Patient Profile     71 y.o. male with known coronary artery disease presents with acute inferoposterior MI, taken emergently for cardiac catheterization and PCI 07/19/2017  Assessment & Plan    1.  Acute STEMI involving the RCA: Patient with critical in-stent restenosis/thrombosis in a large, dominant RCA after remote stenting in 1996.  The patient appears to have had an uncomplicated inferior MI and he has done  well initially with primary PCI using a drug-eluting stent.  We will continue aspirin and ticagrelor.  His heart rhythm is stable and hemodynamics also stable.  We will transfer to a telemetry bed today, mobilize, have him work with inpatient cardiac rehab.  If no complications arise anticipate discharge tomorrow morning.  Will add a troponin to his lab work this morning.  2.  Hyperlipidemia: The patient's LDL cholesterol is 79 mg/dL.  Will initiate a high intensity statin drug.  3.  Hypertension: Blood pressure is elevated.  Continue metoprolol 50 mg twice daily.  Add lisinopril 10 mg daily this morning.  Disposition: Plan to transfer to telemetry today.  Possibly discharge home tomorrow.  For questions or updates, please contact [facility] Please consult [email or web address] for contact info under Cardiology/STEMI.      Enzo Bi, MD  07/20/2017, 8:03 AM

## 2017-07-20 NOTE — Research (Signed)
Informed Consent for ADCY9 Genetic Testing and HIPAA Authorization for the dal-GenE trial   Subject Name: Jason Cabrera  Subject met inclusion and exclusion criteria.  The informed consent form, study requirements and expectations were reviewed with the subject and questions and concerns were addressed prior to the signing of the consent form.  The subject verbalized understanding of the trial requirements.  The subject agreed to participate in the trial and signed the informed consent.  The informed consent was obtained prior to performance of any protocol-specific procedures for the subject.  A copy of the signed informed consent was given to the subject and a copy was placed in the subject's medical record.  Blossom Hoops 07/20/2017, 1:22 PM

## 2017-07-20 NOTE — Care Management Note (Signed)
Case Management Note Donn Pierini RN, BSN Unit 4E-Case Manager [phone]  Patient Details  Name: Jason Cabrera MRN: [id number] Date of Birth: 03-23-1946  Subjective/Objective:  Pt admitted with STEMI s/p  PCI using a drug-eluting stent                  Action/Plan: PTA pt lived at home- independent-  Anticipate return home- referral received for Brilinta needs- insurance check submitted- however pt does not have part D coverage with medicare- spoke with pt at bedside- pt has [employer]- but no part D- per pt [facility]- and gets all his meds via the mail through the [place]- pt goes to the [place] for PCP- [employer]- pt will need to have his PCP at the [place] approve Brilinta in order for him to get the med via the [place]- on discharge CM will provide pt with 30 day free card to use - pt will need script to use for 30 day free with no refill, along with separate script for him to take to the [place]-   CM to f/u prior to discharge.   Expected Discharge Date:                  Expected Discharge Plan:  Home/Self Care  In-House Referral:     Discharge planning Services  CM Consult, Medication Assistance  Post Acute Care Choice:    Choice offered to:     DME Arranged:    DME Agency:     HH Arranged:    HH Agency:     Status of Service:  In process, will continue to follow  If discussed at Long Length of Stay Meetings, dates discussed:    Discharge Disposition:   Additional Comments:  Darrold Span, RN 07/20/2017, 3:15 PM

## 2017-07-20 NOTE — Progress Notes (Signed)
CARDIAC REHAB PHASE I   PRE:  Rate/Rhythm: 74 SR    BP: sitting 158/74    SaO2:   MODE:  Ambulation: 370 ft   POST:  Rate/Rhythm: 78 SR    BP: sitting 158/86     SaO2:   Pt up in room. Able to walk at quick pace, no c/o, feels well. VSS. Ed completed with pt. He understands his Brilinta/ASA. He is an avid exerciser and mostly watches his diet. Wil refer to [place] CRPII. Can walk independently. [id number]   Jason Cabrera CES, ACSM 07/20/2017 11:48 AM

## 2017-07-20 NOTE — Progress Notes (Signed)
  Echocardiogram 2D Echocardiogram has been performed.  Jason Cabrera 07/20/2017, 11:12 AM

## 2017-07-20 NOTE — Progress Notes (Addendum)
Critical lab received of troponin 0.85. Pt chest pain free. VSS. Cardiology PA paged. Primary RN Nehemiah Settle made aware.  Spoke with cardiology PA. No new orders received. Pt resting comfortably in bed. Will re order troponin for the am.  Leonidas Romberg, RN

## 2017-07-21 ENCOUNTER — Telehealth: Payer: Self-pay | Admitting: Physician Assistant

## 2017-07-21 DIAGNOSIS — I2119 ST elevation (STEMI) myocardial infarction involving other coronary artery of inferior wall: Secondary | ICD-10-CM

## 2017-07-21 MED ORDER — NITROGLYCERIN 0.4 MG SL SUBL: 0.4000 mg | SUBLINGUAL_TABLET | SUBLINGUAL | 12 refills | Status: AC | PRN

## 2017-07-21 MED ORDER — TICAGRELOR 90 MG PO TABS: 90.0000 mg | ORAL_TABLET | Freq: Two times a day (BID) | ORAL | 0 refills | Status: DC

## 2017-07-21 MED ORDER — TICAGRELOR 90 MG PO TABS: 90.0000 mg | ORAL_TABLET | Freq: Two times a day (BID) | ORAL | 3 refills | Status: DC

## 2017-07-21 MED ORDER — LISINOPRIL 10 MG PO TABS: 10.0000 mg | ORAL_TABLET | Freq: Every day | ORAL | 6 refills | Status: AC

## 2017-07-21 MED ORDER — ATORVASTATIN CALCIUM 80 MG PO TABS: 80.0000 mg | ORAL_TABLET | Freq: Every day | ORAL | 6 refills | Status: AC

## 2017-07-21 NOTE — Telephone Encounter (Signed)
TOC Pt-Please call Pt-Pt has an appointment on 08-03-17 with Vin.

## 2017-07-21 NOTE — Progress Notes (Signed)
Pt discharging home with girlfriend.  Cath site unremarkable.  All DC instructions and prescriptions given and reviewed.  All questions answered.

## 2017-07-21 NOTE — Telephone Encounter (Signed)
Left message for patient to call us back.  

## 2017-07-21 NOTE — Progress Notes (Signed)
Progress Note  Patient Name: Jason Cabrera Date of Encounter: 07/21/2017  Primary Cardiologist:New to Dr. Burt Knack  Subjective   Feeling well. No chest pain, sob or palpitations.   Inpatient Medications    Scheduled Meds: . aspirin EC  81 mg Oral Daily  . atorvastatin  80 mg Oral q1800  . enoxaparin (LOVENOX) injection  40 mg Subcutaneous Q24H  . lisinopril  10 mg Oral Daily  . metoprolol tartrate  50 mg Oral BID  . rOPINIRole  0.5 mg Oral QHS  . sertraline  50 mg Oral q morning - 10a  . sodium chloride flush  3 mL Intravenous Q12H  . ticagrelor  90 mg Oral BID   Continuous Infusions: . sodium chloride     PRN Meds: sodium chloride, acetaminophen, morphine injection, nitroGLYCERIN, ondansetron (ZOFRAN) IV, oxyCODONE, sodium chloride flush, traZODone   Vital Signs    Vitals:   07/20/17 1600 07/20/17 1752 07/20/17 1945 07/21/17 0401  BP: (!) 152/71 133/74 123/78 (!) 115/58  Pulse:    64  Resp: '17  12 10  '$ Temp: 97.9 F (36.6 C)  98.2 F (36.8 C) 97.8 F (36.6 C)  TempSrc: Oral  Oral Oral  SpO2: 100%  97% 96%  Weight:      Height:        Intake/Output Summary (Last 24 hours) at 07/21/17 0851 Last data filed at 07/20/17 2035  Gross per 24 hour  Intake              600 ml  Output                0 ml  Net              600 ml   Filed Weights   07/19/17 2036  Weight: 215 lb (97.5 kg)    Telemetry    NSR- Personally Reviewed  ECG    Sr with TWI inferiorly - Personally Reviewed  Physical Exam   GEN: No acute distress.   Neck: No JVD Cardiac: RRR, no murmurs, rubs, or gallops.  R radial cath site stable.  Respiratory: Clear to auscultation bilaterally. GI: Soft, nontender, non-distended  MS: No edema; No deformity. Neuro:  Nonfocal  Psych: Normal affect   Labs    Chemistry Recent Labs Lab 07/19/17 2114 07/19/17 2142 07/20/17 0203  NA 140 137 138  K 3.2* 3.1* 3.6  CL 103 104 107  CO2  --  23 25  GLUCOSE 103* 99 112*  BUN '11 8 9    '$ CREATININE 0.70 0.84 0.71  CALCIUM  --  8.3* 8.3*  PROT  --  6.2*  --   ALBUMIN  --  3.7  --   AST  --  22  --   ALT  --  27  --   ALKPHOS  --  52  --   BILITOT  --  0.5  --   GFRNONAA  --  >60 >60  GFRAA  --  >60 >60  ANIONGAP  --  10 6     Hematology Recent Labs Lab 07/19/17 2114 07/19/17 2142 07/20/17 0203  WBC  --  5.5 6.6  RBC  --  3.90* 3.99*  HGB 11.9* 12.5* 12.7*  HCT 35.0* 36.7* 37.6*  MCV  --  94.1 94.2  MCH  --  32.1 31.8  MCHC  --  34.1 33.8  RDW  --  12.7 12.6  PLT  --  184 187    Cardiac Enzymes  Recent Labs Lab 07/19/17 2142 07/20/17 1340  TROPONINI 0.05* 0.85*   Radiology    No results found.  Cardiac Studies   Cardiac catheterization study 07/19/2017: Conclusion   1. Acute inferoposterior MI secondary to critical mid RCA in-stent restenosis/thrombosis, treated successfully with PCI using a 4.0 x 23 mm Sierra DES 2. Mild diffuse nonobstructive coronary artery disease as detailed above 3. Mild segmental contraction abnormality the left ventricle with normal LVEDP and preserved LVEF  Recommend:  Tirofiban x 4 hours  DAPT with ASA and brilinta x 12 months (180 mg loading dose administered in cath lab)  Aggressive risk reduction measures  Consider discharge in 48 hours if no complications arise   Dominance: Right  Left Main  Ost LM to LM lesion, 30% stenosed.  Left Anterior Descending  There is mild the vessel.  Prox LAD lesion, 40% stenosed.  First Diagonal Branch  1st Diag lesion, 40% stenosed.  Left Circumflex  There is mild the vessel.  Ost Cx to Prox Cx lesion, 40% stenosed.  First Obtuse Marginal Branch  Ost 1st Mrg to 1st Mrg lesion, 40% stenosed.  Second Obtuse Marginal Branch  Ost 2nd Mrg to 2nd Mrg lesion, 50% stenosed.  Right Coronary Artery  There is mild the vessel.  Mid RCA lesion, 95% stenosed. The lesion was previously treated using a bare metal stent over 2 years ago.  Angioplasty: Lesion crossed with  guidewire using a WIRE COUGAR XT STRL 190CM. Pre-stent angioplasty was performed using a BALLOON EUPHORA [id number]. A STENT SIERRA 4.00 X 23 MM drug eluting stent was successfully placed. Post-stent angioplasty was performed using a BALLOON [place] EUPHORA [id number]. Maximum pressure: 16 atm. The pre-interventional distal flow is normal (TIMI 3). The post-interventional distal flow is normal (TIMI 3). The intervention was successful . No complications occurred at this lesion.  There is no residual stenosis post intervention.    Echo 07/20/17 Study Conclusions  - Left ventricle: The cavity size was normal. Wall thickness was   increased in a pattern of moderate LVH. Systolic function was   normal. The estimated ejection fraction was in the range of 60%   to 65%. Wall motion was normal; there were no regional wall   motion abnormalities. Left ventricular diastolic function   parameters were normal. - Left atrium: The atrium was normal in size. - Inferior vena cava: The vessel was normal in size. The   respirophasic diameter changes were in the normal range (>= 50%),   consistent with normal central venous pressure.  Impressions:  - LVEF 60-65%, normal wall motion, mild to moderate LVH, normal   diastolic function, normal LA size, normal IVC.  Patient Profile     71 y.o. male w/ history of CAD (PCI to RCA in 1996) and HTN who presents with acute onset of chest pain and found to have a  STEMI on EKG.    Assessment & Plan    1.  Acute STEMI involving the RCA: - Cath showed critical in-stent restenosis/thrombosis in a large, dominant RCA s/p successfully PCI using a 4.0 x 23 mm Sierra DES. Medical management for diffuse mild non obstructive CAD. Echo with normal LVEF. PEak of troponin 0.85. No recurrent chest pain. Ambulating well.  - Continue DAPT with ASA and Brillinta. Continue Lipitor '80mg'$  qd, BB and ACE.   2.  Hyperlipidemia:  - 07/20/2017: Cholesterol 143; HDL 45; LDL Cholesterol 77;  Triglycerides 107; VLDL 21T - Continue statin. Lipid panel and LFT in 6 weeks.  3.  Hypertension:  - Elevated at presentation. Now stable on current medications.   For questions or updates, please contact [place] Please consult [email or web address] for contact info under Cardiology/STEMI.      Signed, Leanor Kail, PA  07/21/2017, 8:51 AM    Patient seen, examined. Available data reviewed. Agree with findings, assessment, and plan as outlined by Robbie Lis, PA.   On exam today the patient is alert and oriented in no distress.  Lungs are clear.  Heart is regular rate and rhythm with no murmur gallop.  Right radial site is clear.  There is no peripheral edema.  Echo reviewed and demonstrates normal LV systolic function with no regional wall motion abnormalities.  Medications reviewed and include dual antiplatelet therapy with aspirin and ticagrelor, a beta-blocker and ACE inhibitor, and atorvastatin 80 mg.  The patient receives much of his medical care through the [place] system.  He will do hospital follow-up with our group and I would be happy to see him if that works.  Otherwise he can follow-up at the [place].  Sherren Mocha, M.D. 07/21/2017 11:38 AM

## 2017-07-21 NOTE — Progress Notes (Signed)
CARDIAC REHAB PHASE I   PRE:  Rate/Rhythm: 78 SR    BP: sitting 130/77    SaO2:   MODE:  Ambulation: 620 ft   POST:  Rate/Rhythm: 89 SR    BP: sitting 154/86     SaO2:   Tolerated well, no c/o. Quick pace. Planning to attend CRPII.  [id number]   Harriet Masson CES, ACSM 07/21/2017 10:47 AM

## 2017-07-21 NOTE — Care Management Note (Addendum)
Case Management Note Donn Pierini RN, BSN Unit 4E-Case Manager [phone]  Patient Details  Name: Jason Cabrera MRN: [id number] Date of Birth: 1945-10-11  Subjective/Objective:  Pt admitted with STEMI s/p  PCI using a drug-eluting stent                  Action/Plan: PTA pt lived at home- independent-  Anticipate return home- referral received for Brilinta needs- insurance check submitted- however pt does not have part D coverage with medicare- spoke with pt at bedside- pt has [employer]- but no part D- per pt [facility]- and gets all his meds via the mail through the [place]- pt goes to the [place] for PCP- [employer]- pt will need to have his PCP at the [place] approve Brilinta in order for him to get the med via the [place]- on discharge CM will provide pt with 30 day free card to use - pt will need script to use for 30 day free with no refill, along with separate script for him to take to the [place]-   CM to f/u prior to discharge.   Expected Discharge Date:                  Expected Discharge Plan:  Home/Self Care  In-House Referral:     Discharge planning Services  CM Consult, Medication Assistance  Post Acute Care Choice:  NA Choice offered to:  NA  DME Arranged:    DME Agency:     HH Arranged:    HH Agency:     Status of Service:  Completed, signed off  If discussed at Long Length of Stay Meetings, dates discussed:    Discharge Disposition: home/self care   Additional Comments:  07/21/17- 1200- Tearra Ouk RN, CM- pt for d/c home today- provided pt with 30 day free card for Brilinta to use today- pt plans to go to [facility]- pt will also need [facility] to begin process to get approved via [place] for his mail order- have made call to the [facility]- return call pending.  1600- update- received call from [facility]- spoke with Junius Roads- CSW at the [place]- pt's PCP is Vernell Barrier at the [place] clinic- [phone]- H&P, d/c  summary has been faxed to pt's PCP for Brilinta needs. Update also provided to [facility] regarding pt's stay and d/c needs.   Darrold Span, RN 07/21/2017, 12:12 PM

## 2017-07-21 NOTE — Discharge Summary (Signed)
Discharge Summary    Patient ID: Jason Cabrera,  MRN: [id number], DOB/AGE: [birth year] 71 y.o.  Admit date: 07/19/2017 Discharge date: 07/21/2017  Primary Care Provider: No primary care provider on file. Primary Cardiologist: New to Dr. Burt Knack (may be followed by [facility])  Discharge Diagnoses    Active Problems:   Acute inferoposterior myocardial infarction [facility])   STEMI involving right coronary artery ([place])   HTN   HLD  Allergies No Known Allergies  Diagnostic Studies/Procedures    Cardiac catheterization study 07/19/2017: Conclusion   1. Acute inferoposterior MI secondary to critical mid RCA in-stent restenosis/thrombosis, treated successfully with PCI using a 4.0 x 23 mm Sierra DES 2. Mild diffuse nonobstructive coronary artery disease as detailed above 3. Mild segmental contraction abnormality the left ventricle with normal LVEDP and preserved LVEF  Recommend:  Tirofiban x 4 hours  DAPT with ASA and brilinta x 12 months (180 mg loading dose administered in cath lab)  Aggressive risk reduction measures  Consider discharge in 48 hours if no complications arise   Dominance: Right  Left Main  Ost LM to LM lesion, 30% stenosed.  Left Anterior Descending  There is mild the vessel.  Prox LAD lesion, 40% stenosed.  First Diagonal Branch  1st Diag lesion, 40% stenosed.  Left Circumflex  There is mild the vessel.  Ost Cx to Prox Cx lesion, 40% stenosed.  First Obtuse Marginal Branch  Ost 1st Mrg to 1st Mrg lesion, 40% stenosed.  Second Obtuse Marginal Branch  Ost 2nd Mrg to 2nd Mrg lesion, 50% stenosed.  Right Coronary Artery  There is mild the vessel.  Mid RCA lesion, 95% stenosed. The lesion was previously treated using a bare metal stent over 2 years ago.  Angioplasty: Lesion crossed with guidewire using a WIRE COUGAR XT STRL 190CM. Pre-stent angioplasty was performed using a BALLOON EUPHORA RX2.5X15. A STENT SIERRA 4.00 X 23 MM drug eluting stent was  successfully placed. Post-stent angioplasty was performed using a BALLOON [place] EUPHORA [id number]. Maximum pressure: 16 atm. The pre-interventional distal flow is normal (TIMI 3). The post-interventional distal flow is normal (TIMI 3). The intervention was successful . No complications occurred at this lesion.  There is no residual stenosis post intervention.    Echo 07/20/17 Study Conclusions  - Left ventricle: The cavity size was normal. Wall thickness was increased in a pattern of moderate LVH. Systolic function was normal. The estimated ejection fraction was in the range of 60% to 65%. Wall motion was normal; there were no regional wall motion abnormalities. Left ventricular diastolic function parameters were normal. - Left atrium: The atrium was normal in size. - Inferior vena cava: The vessel was normal in size. The respirophasic diameter changes were in the normal range (>= 50%), consistent with normal central venous pressure.  Impressions:  - LVEF 60-65%, normal wall motion, mild to moderate LVH, normal diastolic function, normal LA size, normal IVC.    History of Present Illness        71 y.o. male w/ history of CAD (PCI to RCA in 1996) and HTN who presents with acute onset of chest pain and found to have a  STEMI on EKG.   Jason Cabrera was in his usual state of health when this evening while sitting and talking with his girlfriend he developed chest pressure/tighthness. It radiated to his left jaw and left shoulder. Denied nausea, vomiting or diaphoresis - something he had with his prior MI.   Denies any tobacco, drugs or  ETOH. Does not take his ASA regularly.   Hospital Course     Consultants: None  1. Acute STEMI involving the RCA: - Cath showed critical in-stent restenosis/thrombosis in a large, dominant RCA s/p successfully PCI using a 4.0 x 23 mm Sierra DES. Medical management for diffuse mild non obstructive CAD. Echo with normal LVEF. Peak of  troponin 0.85. No recurrent chest pain. Ambulating well.  Continue DAPT with ASA and Brillinta. Continue Lipitor '80mg'$  qd, BB and ACE.   2. Hyperlipidemia:  - 07/20/2017: Cholesterol 143; HDL 45; LDL Cholesterol 77; Triglycerides 107; VLDL 21T - Continue statin. Lipid panel and LFT in 6 weeks.   3. Hypertension:  - Elevated at presentation. Now stable on current medications.    The patient has been seen by Dr. Burt Knack  today and deemed ready for discharge home. All follow-up appointments have been scheduled. Discharge medications are listed below.  _____________   Discharge Vitals Blood pressure (!) 115/58, pulse 64, temperature 97.8 F (36.6 C), temperature source Oral, resp. rate 10, height '6\' 1"'$  (1.854 m), weight 215 lb (97.5 kg), SpO2 96 %.  Filed Weights   07/19/17 2036  Weight: 215 lb (97.5 kg)    Labs & Radiologic Studies     CBC  Recent Labs  07/19/17 2142 07/20/17 0203  WBC 5.5 6.6  HGB 12.5* 12.7*  HCT 36.7* 37.6*  MCV 94.1 94.2  PLT 184 829   Basic Metabolic Panel  Recent Labs  07/19/17 2142 07/20/17 0203  NA 137 138  K 3.1* 3.6  CL 104 107  CO2 23 25  GLUCOSE 99 112*  BUN 8 9  CREATININE 0.84 0.71  CALCIUM 8.3* 8.3*   Liver Function Tests  Recent Labs  07/19/17 2142  AST 22  ALT 27  ALKPHOS 52  BILITOT 0.5  PROT 6.2*  ALBUMIN 3.7   No results for input(s): LIPASE, AMYLASE in the last 72 hours. Cardiac Enzymes  Recent Labs  07/19/17 2142 07/20/17 1340  TROPONINI 0.05* 0.85*   BNP Invalid input(s): POCBNP D-Dimer No results for input(s): DDIMER in the last 72 hours. Hemoglobin A1C  Recent Labs  07/19/17 2142  HGBA1C 5.3   Fasting Lipid Panel  Recent Labs  07/20/17 0203  CHOL 143  HDL 45  LDLCALC 77  TRIG 107  CHOLHDL 3.2   Thyroid Function Tests No results for input(s): TSH, T4TOTAL, T3FREE, THYROIDAB in the last 72 hours.  Invalid input(s): FREET3  No results found.  Disposition   Pt is being  discharged home today in good condition.  Follow-up Plans & Appointments    Follow-up Information    [facility], [place], [place]. Go on 08/03/2017.   Specialty:  Cardiology Why:  '@9am'$  for post hospital TCM Contact information: [place] [address] [phone]          Discharge Instructions    Amb Referral to Cardiac Rehabilitation    Complete by:  As directed    Diagnosis:   Coronary Stents STEMI PTCA     Diet - low sodium heart healthy    Complete by:  As directed    Discharge instructions    Complete by:  As directed    No driving for 2 weeks. No lifting over 10 lbs for 4 weeks. No sexual activity for 4 weeks. You may not return to work until cleared by your cardiologist.  Keep procedure site clean & dry. If you notice increased pain, swelling, bleeding or pus, call/return!  You may shower, but no soaking baths/hot tubs/pools for 1 week.   Increase activity slowly    Complete by:  As directed       Discharge Medications   Current Discharge Medication List    START taking these medications   Details  atorvastatin (LIPITOR) 80 MG tablet Take 1 tablet (80 mg total) by mouth daily at 6 PM. Qty: 30 tablet, Refills: 6    lisinopril (PRINIVIL,ZESTRIL) 10 MG tablet Take 1 tablet (10 mg total) by mouth daily. Qty: 30 tablet, Refills: 6    nitroGLYCERIN (NITROSTAT) 0.4 MG SL tablet Place 1 tablet (0.4 mg total) under the tongue every 5 (five) minutes x 3 doses as needed for chest pain. Qty: 25 tablet, Refills: 12    !! ticagrelor (BRILINTA) 90 MG TABS tablet Take 1 tablet (90 mg total) by mouth 2 (two) times daily. Qty: 180 tablet, Refills: 3    !! ticagrelor (BRILINTA) 90 MG TABS tablet Take 1 tablet (90 mg total) by mouth 2 (two) times daily. For 30 days free supply Qty: 60 tablet, Refills: 0     !! - Potential duplicate medications found. Please discuss with provider.    CONTINUE these medications which have NOT CHANGED   Details  aspirin  EC 81 MG tablet Take 81 mg by mouth daily.    hydrOXYzine (VISTARIL) 25 MG capsule Take 25 mg by mouth daily as needed for anxiety.    metoprolol tartrate (LOPRESSOR) 50 MG tablet Take 50 mg by mouth 2 (two) times daily.    rOPINIRole (REQUIP) 0.5 MG tablet Take 0.5 mg by mouth at bedtime.    sertraline (ZOLOFT) 100 MG tablet Take 50 mg by mouth every morning.    tadalafil (CIALIS) 10 MG tablet Take 10 mg by mouth daily as needed for erectile dysfunction.    traZODone (DESYREL) 50 MG tablet Take 50 mg by mouth at bedtime as needed for sleep.         Aspirin prescribed at discharge?  Yes High Intensity Statin Prescribed? (Lipitor 40-'80mg'$  or Crestor 20-'40mg'$ ): Yes Beta Blocker Prescribed? Yes For EF 45% or less, Was ACEI/ARB Prescribed? Yes ADP Receptor Inhibitor Prescribed? (i.e. Plavix etc.-Includes Medically Managed Patients): Yes For EF <40%, Aldosterone Inhibitor Prescribed? N/A Was EF assessed during THIS hospitalization? Yes Was Cardiac Rehab II ordered? (Included Medically managed Patients): Yes   Outstanding Labs/Studies   Consider OP f/u labs 6-8 weeks given statin initiation this admission.  Duration of Discharge Encounter   Greater than 30 minutes including physician time.  Signed, Daniela Siebers PA-C 07/21/2017, 12:15 PM

## 2017-07-22 ENCOUNTER — Telehealth (HOSPITAL_COMMUNITY): Payer: Self-pay

## 2017-07-22 NOTE — Telephone Encounter (Signed)
Patients insurance is active and benefits verified with [employer] - $10.00 co-pay, no deductible, out of pocket amount is $5,500/$0.00 has been met, no co-insurance, and no pre-authorization is required. Passport/reference [phone]  Patient will be contacted and scheduled after their follow up visit with the cardiologist office upon review by the RN Navigator.

## 2017-07-25 NOTE — Telephone Encounter (Signed)
**Note De-Identified Oluwatomisin Deman Obfuscation** Patient contacted regarding discharge from [facility] on 07/21/17.  Patient understands to follow up with provider Chelsea Aus, PA-c on 08/03/17 at 9 am at [address] [facility] [address] in [place]. Patient understands discharge instructions? Yes Patient understands medications and regiment? Yes Patient understands to bring all medications to this visit? Yes  The pt states that he is doing well and has no complaints at this time. He does have [facility]'s phone number to call if he has any questions or concerns.

## 2017-07-31 NOTE — Progress Notes (Signed)
Cardiology Office Note    Date:  08/03/2017   ID:  Jason, Cabrera 1946-08-01, MRN [id number]  PCP:  No primary care provider on file.  Cardiologist:  New to Dr. Burt Knack (may be followed by [facility])  Chief Complaint: Hospital follow up for STEMI  History of Present Illness:   Jason Cabrera is a 71 y.o. male history of CAD (PCI to RCA in 1996) and HTN presented for hospital follow up.  Admitted 10/30-11/1/18 with STEMI. Cath showed critical in-stent restenosis/thrombosis in a large, dominant RCA s/p successfully PCI using a 4.0 x 23 mm Sierra DES. Medical management for diffuse mild non obstructive CAD. Echo with normal LVEF. Peak of troponin 0.85. Started on lipitor.   Here today for follow up.  He is walking approximately 10-15 minutes multiple times a day without angina or shortness of breath.  He denies orthopnea, PND, syncope, lower extremity edema, melena, dizziness or palpitations.  He is unsure if he is going [facility] or [facility]  Also unsure about enrollment in cardiac rehab either with [place] or [facility].  He will let us know if he needs referral for cardiac rehabilitation.        Past Medical History:  Diagnosis Date  . 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 Coronary artery disease    a. cath 07/19/17 critical in-stent restenosis/thrombosis in a large, dominant RCA s/p successfully PCI using a 4.0 x 23 mm Sierra DES.. Medical managment for mild non obstructive CAD  . Hypertension     Past Surgical History:  Procedure Laterality Date  . CARDIAC SURGERY  1996   stents    Current Medications: Prior to Admission medications   Medication Sig Start Date End Date Taking? Authorizing Provider  aspirin EC 81 MG tablet Take 81 mg by mouth daily.    [provider]  atorvastatin (LIPITOR) 80 MG tablet Take 1 tablet (80 mg total) by mouth daily at 6 PM. 07/21/17   Jantzen Pilger, PA  hydrOXYzine (VISTARIL) 25 MG capsule  Take 25 mg by mouth daily as needed for anxiety.    [provider]  lisinopril (PRINIVIL,ZESTRIL) 10 MG tablet Take 1 tablet (10 mg total) by mouth daily. 07/22/17   Nature Vogelsang, Crista Luria, PA  metoprolol tartrate (LOPRESSOR) 50 MG tablet Take 50 mg by mouth 2 (two) times daily.    [provider]  nitroGLYCERIN (NITROSTAT) 0.4 MG SL tablet Place 1 tablet (0.4 mg total) under the tongue every 5 (five) minutes x 3 doses as needed for chest pain. 07/21/17   Yousef Huge, Crista Luria, PA  rOPINIRole (REQUIP) 0.5 MG tablet Take 0.5 mg by mouth at bedtime.    [provider]  sertraline (ZOLOFT) 100 MG tablet Take 50 mg by mouth every morning.    [provider]  tadalafil (CIALIS) 10 MG tablet Take 10 mg by mouth daily as needed for erectile dysfunction.    [provider]  ticagrelor (BRILINTA) 90 MG TABS tablet Take 1 tablet (90 mg total) by mouth 2 (two) times daily. 07/21/17   Leanor Kail, PA  ticagrelor (BRILINTA) 90 MG TABS tablet Take 1 tablet (90 mg total) by mouth 2 (two) times daily. For 30 days free supply 07/21/17   Chelle Cayton, [place], PA  traZODone (DESYREL) 50 MG tablet Take 50 mg by mouth at bedtime as needed for sleep.    [provider]    Allergies:   Patient has no known allergies.   Social  History   Socioeconomic History  . Marital status: Divorced    Spouse name: Not on file  . Number of children: Not on file  . Years of education: Not on file  . Highest education level: Not on file  Social Needs  . Financial resource strain: Not on file  . Food insecurity - worry: Not on file  . Food insecurity - inability: Not on file  . Transportation needs - medical: Not on file  . Transportation needs - non-medical: Not on file  Occupational History  . Not on file  Tobacco Use  . Smoking status: Never Smoker  . Smokeless tobacco: Never Used  Substance and Sexual Activity  . Alcohol use: No  . Drug use: No  . Sexual activity:  Not on file  Other Topics Concern  . Not on file  Social History Narrative  . Not on file     Family History:  The patient's family history is not on file.   ROS:   Please see the history of present illness.    ROS All other systems reviewed and are negative.   PHYSICAL EXAM:   VS:  BP 124/74   Pulse 64   Ht _0  (1.854 m)   Wt 210 lb (95.3 kg)   BMI 27.71 kg/m    GEN: Well nourished, well developed, in no acute distress  HEENT: normal  Neck: no JVD, carotid bruits, or masses Cardiac: RRR; no murmurs, rubs, or gallops,no edema  Respiratory:  clear to auscultation bilaterally, normal work of breathing GI: soft, nontender, nondistended, + BS MS: no deformity or atrophy  Skin: warm and dry, no rash Neuro:  Alert and Oriented x 3, Strength and sensation are intact Psych: euthymic mood, full affect  Wt Readings from Last 3 Encounters:  08/03/17 210 lb (95.3 kg)  07/19/17 215 lb (97.5 kg)      Studies/Labs Reviewed:   EKG:  EKG is not ordered today.    Recent Labs: 07/19/2017: ALT 27 07/20/2017: BUN 9; Creatinine, Ser 0.71; Hemoglobin 12.7; Platelets 187; Potassium 3.6; Sodium 138   Lipid Panel    Component Value Date/Time   CHOL 143 07/20/2017 0203   TRIG 107 07/20/2017 0203   HDL 45 07/20/2017 0203   CHOLHDL 3.2 07/20/2017 0203   VLDL 21 07/20/2017 0203   LDLCALC 77 07/20/2017 0203    Additional studies/ records that were reviewed today include:  Cardiac catheterization study 07/19/2017: Conclusion   1. Acute inferoposterior MI secondary to critical mid RCA in-stent restenosis/thrombosis, treated successfully with PCI using a 4.0 x 23 mm Sierra DES 2. Mild diffuse nonobstructive coronary artery disease as detailed above 3. Mild segmental contraction abnormality the left ventricle with normal LVEDP and preserved LVEF  Recommend:  Tirofiban x 4 hours  DAPT with ASA and brilinta x 12 months (180 mg loading dose administered in cath  lab)  Aggressive risk reduction measures  Consider discharge in 48 hours if no complications arise   Dominance: Right  Left Main  Ost LM to LM lesion, 30% stenosed.  Left Anterior Descending  There is mild the vessel.  Prox LAD lesion, 40% stenosed.  First Diagonal Branch  1st Diag lesion, 40% stenosed.  Left Circumflex  There is mild the vessel.  Ost Cx to Prox Cx lesion, 40% stenosed.  First Obtuse Marginal Branch  Ost 1st Mrg to 1st Mrg lesion, 40% stenosed.  Second Obtuse Marginal Branch  Ost 2nd Mrg to 2nd Mrg lesion, 50% stenosed.  Right Coronary Artery  There is mild the vessel.  Mid RCA lesion, 95% stenosed. The lesion was previously treated using a bare metal stent over 2 years ago.  Angioplasty: Lesion crossed with guidewire using a WIRE COUGAR XT STRL 190CM. Pre-stent angioplasty was performed using a BALLOON EUPHORA [id number]. A STENT SIERRA 4.00 X 23 MM drug eluting stent was successfully placed. Post-stent angioplasty was performed using a BALLOON Stamps EUPHORA [id number]. Maximum pressure: 16 atm. The pre-interventional distal flow is normal (TIMI 3). The post-interventional distal flow is normal (TIMI 3). The intervention was successful . No complications occurred at this lesion.  There is no residual stenosis post intervention.    Echo 07/20/17 Study Conclusions  - Left ventricle: The cavity size was normal. Wall thickness was increased in a pattern of moderate LVH. Systolic function was normal. The estimated ejection fraction was in the range of 60% to 65%. Wall motion was normal; there were no regional wall motion abnormalities. Left ventricular diastolic function parameters were normal. - Left atrium: The atrium was normal in size. - Inferior vena cava: The vessel was normal in size. The respirophasic diameter changes were in the normal range (>= 50%), consistent with normal central venous pressure.  Impressions:  - LVEF 60-65%, normal  wall motion, mild to moderate LVH, normal diastolic function, normal LA size, normal IVC.       ASSESSMENT & PLAN:    1. CAD  - Cath showed critical in-stent restenosis/thrombosis in a large, dominant RCA s/p successfully PCI using a 4.0 x 23 mm Sierra DES. Medical management for diffuse mild non obstructive CAD. -Continue aspirin, Brilinta, Lipitor, Lopressor and lisinopril.   2. Hyperlipidemia:  - 07/20/2017: Cholesterol 143; HDL 45; LDL Cholesterol 77; Triglycerides 107; VLDL 21T - Continue statin. Lipid panel and LFT in 4-6 weeks.   3. Hypertension:  -Stable and well controlled on current regimen.  Follow up with Dr. Burt Knack [facility]    Medication Adjustments/Labs and Tests Ordered: Current medicines are reviewed at length with the patient today.  Concerns regarding medicines are outlined above.  Medication changes, Labs and Tests ordered today are listed in the Patient Instructions below. Patient Instructions  Medication Instructions:  Your physician recommends that you continue on your current medications as directed. Please refer to the Current Medication list given to you today.   Labwork: 4 WEEKS FASTING LIPID AND LIVER PANEL TO BE DONE  Testing/Procedures: NONE ORDERED TODAY  Follow-Up: DR. Burt Knack IN 3-4 MONTHS   Any Other Special Instructions Will Be Listed Below (If Applicable).     If you need a refill on your cardiac medications before your next appointment, please call your pharmacy.      Jarrett Soho, [place]  08/03/2017 9:28 AM    [facility] [address], [place], [place]  [address] Phone: [phone]; Fax: [phone]

## 2017-08-03 ENCOUNTER — Encounter: Payer: Self-pay | Admitting: Physician Assistant

## 2017-08-03 ENCOUNTER — Ambulatory Visit: Payer: Medicare PPO | Admitting: Physician Assistant

## 2017-08-03 VITALS — BP 124/74 | HR 64 | Ht 73.0 in | Wt 210.0 lb

## 2017-08-03 DIAGNOSIS — I25110 Atherosclerotic heart disease of native coronary artery with unstable angina pectoris: Secondary | ICD-10-CM | POA: Diagnosis not present

## 2017-08-03 DIAGNOSIS — I10 Essential (primary) hypertension: Secondary | ICD-10-CM

## 2017-08-03 DIAGNOSIS — I2111 ST elevation (STEMI) myocardial infarction involving right coronary artery: Secondary | ICD-10-CM | POA: Diagnosis not present

## 2017-08-03 DIAGNOSIS — E785 Hyperlipidemia, unspecified: Secondary | ICD-10-CM

## 2017-08-03 NOTE — Patient Instructions (Addendum)
Medication Instructions:  Your physician recommends that you continue on your current medications as directed. Please refer to the Current Medication list given to you today.   Labwork: 4 WEEKS FASTING LIPID AND LIVER PANEL TO BE DONE: 09/02/17, LAB OPENS 7:30 AM. NOTHING TO EAT AFTER MIDNIGHT, MAY TAKE MORNING MEDICATIONS WITH WATER THE MORNING OF LAB WORK  Testing/Procedures: NONE ORDERED TODAY  Follow-Up: DR. Excell Seltzer ON 11/04/17 @ 8:40  Any Other Special Instructions Will Be Listed Below (If Applicable).     If you need a refill on your cardiac medications before your next appointment, please call your pharmacy.

## 2017-08-08 ENCOUNTER — Telehealth: Payer: Self-pay | Admitting: *Deleted

## 2017-08-09 ENCOUNTER — Telehealth (HOSPITAL_COMMUNITY): Payer: Self-pay

## 2017-08-09 NOTE — Telephone Encounter (Signed)
Called and spoke with patient in regards to Cardiac Rehab - Patient is beginning exercise at the [facility]. Not interested in this program. Closed Referral.

## 2017-08-10 NOTE — Telephone Encounter (Signed)
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 If he had any questions to please feel free to call back. Thanked him for participating in the trial.

## 2017-08-31 ENCOUNTER — Telehealth (HOSPITAL_COMMUNITY): Payer: Self-pay

## 2017-08-31 NOTE — Telephone Encounter (Signed)
Attempted to call patient in regards to Cardiac Rehab - [facility] new [facility] order - patient stated to [place] he was interested in program. Lm on Vm.

## 2017-09-02 ENCOUNTER — Other Ambulatory Visit: Payer: Medicare PPO

## 2017-09-29 ENCOUNTER — Telehealth (HOSPITAL_COMMUNITY): Payer: Self-pay | Admitting: Pharmacy Technician

## 2017-10-04 ENCOUNTER — Encounter (HOSPITAL_COMMUNITY)
Admission: RE | Admit: 2017-10-04 | Discharge: 2017-10-04 | Disposition: A | Discharge status: Home / Self Care | Payer: Non-veteran care | Source: Ambulatory Visit | Attending: Cardiovascular Disease | Admitting: Cardiovascular Disease

## 2017-10-04 VITALS — Ht 71.25 in | Wt 209.2 lb

## 2017-10-04 DIAGNOSIS — Z7982 Long term (current) use of aspirin: Secondary | ICD-10-CM | POA: Insufficient documentation

## 2017-10-04 DIAGNOSIS — I10 Essential (primary) hypertension: Secondary | ICD-10-CM | POA: Insufficient documentation

## 2017-10-04 DIAGNOSIS — I2510 Atherosclerotic heart disease of native coronary artery without angina pectoris: Secondary | ICD-10-CM | POA: Insufficient documentation

## 2017-10-04 DIAGNOSIS — Z955 Presence of coronary angioplasty implant and graft: Secondary | ICD-10-CM | POA: Insufficient documentation

## 2017-10-04 DIAGNOSIS — Z79899 Other long term (current) drug therapy: Secondary | ICD-10-CM | POA: Insufficient documentation

## 2017-10-04 DIAGNOSIS — I2111 ST elevation (STEMI) myocardial infarction involving right coronary artery: Secondary | ICD-10-CM | POA: Insufficient documentation

## 2017-10-04 NOTE — Progress Notes (Signed)
Cardiac Individual Treatment Plan  Patient Details  Name: Jason Cabrera MRN: [id number] Date of Birth: 1946-06-10 Referring Provider:     CARDIAC REHAB PHASE II ORIENTATION from 10/04/2017 in [facility] CARDIAC [facility]  Referring Provider  Tonny Bollman MD      Initial Encounter Date:    CARDIAC REHAB PHASE II ORIENTATION from 10/04/2017 in [facility] CARDIAC REHAB  Date  10/04/17  Referring Provider  Tonny Bollman MD      Visit Diagnosis: ST elevation myocardial infarction involving right coronary artery [facility])  Status post coronary artery stent placement  Patient's Home Medications on Admission:  Current Outpatient Medications:  .  aspirin EC 81 MG tablet, Take 81 mg by mouth daily., Disp: , Rfl:  .  atorvastatin (LIPITOR) 80 MG tablet, Take 1 tablet (80 mg total) by mouth daily at 6 PM., Disp: 30 tablet, Rfl: 6 .  hydrOXYzine (VISTARIL) 25 MG capsule, Take 25 mg by mouth daily as needed for anxiety., Disp: , Rfl:  .  lisinopril (PRINIVIL,ZESTRIL) 10 MG tablet, Take 1 tablet (10 mg total) by mouth daily., Disp: 30 tablet, Rfl: 6 .  metoprolol tartrate (LOPRESSOR) 50 MG tablet, Take 50 mg by mouth 2 (two) times daily., Disp: , Rfl:  .  nitroGLYCERIN (NITROSTAT) 0.4 MG SL tablet, Place 1 tablet (0.4 mg total) under the tongue every 5 (five) minutes x 3 doses as needed for chest pain., Disp: 25 tablet, Rfl: 12 .  rOPINIRole (REQUIP) 0.5 MG tablet, Take 0.5 mg by mouth at bedtime., Disp: , Rfl:  .  sertraline (ZOLOFT) 100 MG tablet, Take 50 mg by mouth every morning., Disp: , Rfl:  .  tadalafil (CIALIS) 10 MG tablet, Take 10 mg by mouth daily as needed for erectile dysfunction., Disp: , Rfl:  .  ticagrelor (BRILINTA) 90 MG TABS tablet, Take 1 tablet (90 mg total) by mouth 2 (two) times daily. For 30 days free supply, Disp: 60 tablet, Rfl: 0 .  traZODone (DESYREL) 50 MG tablet, Take 50 mg by mouth at bedtime as needed for sleep., Disp: , Rfl:    Past Medical History: Past Medical History:  Diagnosis Date  . 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 Coronary artery disease    a. cath 07/19/17 critical in-stent restenosis/thrombosis in a large, dominant RCA s/p successfully PCI using a 4.0 x 23 mm Sierra DES.. Medical managment for mild non obstructive CAD  . Hypertension     Tobacco Use: Social History   Tobacco Use  Smoking Status Never Smoker  Smokeless Tobacco Never Used    Labs: Recent Review Flowsheet Data    Labs for ITP Cardiac and Pulmonary Rehab Latest Ref Rng & Units 07/19/2017 07/20/2017   Cholestrol 0 - 200 mg/dL 409 811   LDLCALC 0 - 99 mg/dL 79 77   HDL >91 mg/dL 47(W) 45   Trlycerides <150 mg/dL 295 621   Hemoglobin H0Q 4.8 - 5.6 % 5.3 -   TCO2 22 - 32 mmol/L 24 -      Capillary Blood Glucose: No results found for: GLUCAP   Exercise Target Goals: Date: 10/04/17  Exercise Program Goal: Individual exercise prescription set with THRR, safety & activity barriers. Participant demonstrates ability to understand and report RPE using BORG scale, to self-measure pulse accurately, and to acknowledge the importance of the exercise prescription.  Exercise Prescription Goal: Starting with aerobic activity 30 plus minutes a day, 3 days per week for initial exercise prescription. Provide  home exercise prescription and guidelines that participant acknowledges understanding prior to discharge.  Activity Barriers & Risk Stratification: Activity Barriers & Cardiac Risk Stratification - 10/04/17 1155      Activity Barriers & Cardiac Risk Stratification   Activity Barriers  Muscular Weakness;Deconditioning    Cardiac Risk Stratification  High       6 Minute Walk: 6 Minute Walk    Row Name 10/04/17 1154         6 Minute Walk   Phase  Initial     Distance  1870 feet     Walk Time  6 minutes     # of Rest Breaks  0     MPH  3.54     METS  3.87     RPE  11     VO2 Peak  13.57      Symptoms  No     Resting HR  69 bpm     Resting BP  132/70     Resting Oxygen Saturation   99 %     Exercise Oxygen Saturation  during 6 min walk  100 %     Max Ex. HR  87 bpm     Max Ex. BP  144/80     2 Minute Post BP  108/60        Oxygen Initial Assessment:   Oxygen Re-Evaluation:   Oxygen Discharge (Final Oxygen Re-Evaluation):   Initial Exercise Prescription: Initial Exercise Prescription - 10/04/17 1100      Date of Initial Exercise RX and Referring Provider   Date  10/04/17    Referring Provider  Tonny Bollman MD      Treadmill   MPH  3    Grade  0    Minutes  15    METs  3.3      Bike   Level  1    Minutes  15    METs  2.99      NuStep   Level  3    SPM  80    Minutes  15    METs  2.2      Prescription Details   Frequency (times per week)  3    Duration  Progress to 30 minutes of continuous aerobic without signs/symptoms of physical distress      Intensity   THRR 40-80% of Max Heartrate  60-121    Ratings of Perceived Exertion  11-15    Perceived Dyspnea  0-4      Progression   Progression  Continue to progress workloads to maintain intensity without signs/symptoms of physical distress.      Resistance Training   Training Prescription  Yes    Weight  3lbs    Reps  10-15       Perform Capillary Blood Glucose checks as needed.  Exercise Prescription Changes:   Exercise Comments:   Exercise Goals and Review: Exercise Goals    Row Name 10/04/17 0910             Exercise Goals   Increase Physical Activity  Yes       Intervention  Develop an individualized exercise prescription for aerobic and resistive training based on initial evaluation findings, risk stratification, comorbidities and participant's personal goals.;Provide advice, education, support and counseling about physical activity/exercise needs.       Expected Outcomes  Achievement of increased cardiorespiratory fitness and enhanced flexibility, muscular endurance and  strength shown through measurements of functional capacity and personal  statement of participant.       Increase Strength and Stamina  Yes       Intervention  Provide advice, education, support and counseling about physical activity/exercise needs.;Develop an individualized exercise prescription for aerobic and resistive training based on initial evaluation findings, risk stratification, comorbidities and participant's personal goals.       Expected Outcomes  Achievement of increased cardiorespiratory fitness and enhanced flexibility, muscular endurance and strength shown through measurements of functional capacity and personal statement of participant.       Able to understand and use rate of perceived exertion (RPE) scale  Yes       Intervention  Provide education and explanation on how to use RPE scale       Expected Outcomes  Short Term: Able to use RPE daily in rehab to express subjective intensity level;Long Term:  Able to use RPE to guide intensity level when exercising independently       Knowledge and understanding of Target Heart Rate Range (THRR)  Yes       Intervention  Provide education and explanation of THRR including how the numbers were predicted and where they are located for reference       Expected Outcomes  Short Term: Able to state/look up THRR;Long Term: Able to use THRR to govern intensity when exercising independently;Short Term: Able to use daily as guideline for intensity in rehab       Able to check pulse independently  Yes       Intervention  Provide education and demonstration on how to check pulse in carotid and radial arteries.;Review the importance of being able to check your own pulse for safety during independent exercise       Expected Outcomes  Short Term: Able to explain why pulse checking is important during independent exercise;Long Term: Able to check pulse independently and accurately       Understanding of Exercise Prescription  Yes       Intervention  Provide  education, explanation, and written materials on patient's individual exercise prescription       Expected Outcomes  Short Term: Able to explain program exercise prescription;Long Term: Able to explain home exercise prescription to exercise independently          Exercise Goals Re-Evaluation :    Discharge Exercise Prescription (Final Exercise Prescription Changes):   Nutrition:  Target Goals: Understanding of nutrition guidelines, daily intake of sodium 1500mg , cholesterol 200mg , calories 30% from fat and 7% or less from saturated fats, daily to have 5 or more servings of fruits and vegetables.  Biometrics: Pre Biometrics - 10/04/17 1206      Pre Biometrics   Height  5' 11.25" (1.81 m)    Weight  209 lb 3.5 oz (94.9 kg)    Waist Circumference  41 inches    Hip Circumference  43.25 inches    Waist to Hip Ratio  0.95 %    BMI (Calculated)  28.97    Triceps Skinfold  15 mm    % Body Fat  28.2 %    Grip Strength  44 kg    Flexibility  12 in    Single Leg Stand  27.69 seconds        Nutrition Therapy Plan and Nutrition Goals:   Nutrition Discharge: Nutrition Scores:   Nutrition Goals Re-Evaluation:   Nutrition Goals Re-Evaluation:   Nutrition Goals Discharge (Final Nutrition Goals Re-Evaluation):   Psychosocial: Target Goals: Acknowledge presence or absence of significant depression and/or  stress, maximize coping skills, provide positive support system. Participant is able to verbalize types and ability to use techniques and skills needed for reducing stress and depression.  Initial Review & Psychosocial Screening: Initial Psych Review & Screening - 10/04/17 1027      Initial Review   Current issues with  None Identified      Family Dynamics   Good Support System?  Yes  (Significant)  Friend     Comments  no psychosocial needs identified, no interventions necessary       Barriers   Psychosocial barriers to participate in program  There are no identifiable  barriers or psychosocial needs.      Screening Interventions   Interventions  Encouraged to exercise       Quality of Life Scores: Quality of Life - 10/04/17 1028      Quality of Life Scores   Health/Function Pre  25.73 %    Socioeconomic Pre  28.44 %    Psych/Spiritual Pre  24.21 %    Family Pre  27.1 %    GLOBAL Pre  26.24 %       PHQ-9: Recent Review Flowsheet Data    There is no flowsheet data to display.     Interpretation of Total Score  Total Score Depression Severity:  1-4 = Minimal depression, 5-9 = Mild depression, 10-14 = Moderate depression, 15-19 = Moderately severe depression, 20-27 = Severe depression   Psychosocial Evaluation and Intervention:   Psychosocial Re-Evaluation:   Psychosocial Discharge (Final Psychosocial Re-Evaluation):   Vocational Rehabilitation: Provide vocational rehab assistance to qualifying candidates.   Vocational Rehab Evaluation & Intervention: Vocational Rehab - 10/04/17 1027      Initial Vocational Rehab Evaluation & Intervention   Assessment shows need for Vocational Rehabilitation  No       Education: Education Goals: Education classes will be provided on a weekly basis, covering required topics. Participant will state understanding/return demonstration of topics presented.  Learning Barriers/Preferences: Learning Barriers/Preferences - 10/04/17 0911      Learning Barriers/Preferences   Learning Barriers  Sight    Learning Preferences  Verbal Instruction;Video;Written Material;Pictoral;Skilled Demonstration       Education Topics: Count Your Pulse:  -Group instruction provided by verbal instruction, demonstration, patient participation and written materials to support subject.  Instructors address importance of being able to find your pulse and how to count your pulse when at home without a heart monitor.  Patients get hands on experience counting their pulse with staff help and individually.   Heart Attack,  Angina, and Risk Factor Modification:  -Group instruction provided by verbal instruction, video, and written materials to support subject.  Instructors address signs and symptoms of angina and heart attacks.    Also discuss risk factors for heart disease and how to make changes to improve heart health risk factors.   Functional Fitness:  -Group instruction provided by verbal instruction, demonstration, patient participation, and written materials to support subject.  Instructors address safety measures for doing things around the house.  Discuss how to get up and down off the floor, how to pick things up properly, how to safely get out of a chair without assistance, and balance training.   Meditation and Mindfulness:  -Group instruction provided by verbal instruction, patient participation, and written materials to support subject.  Instructor addresses importance of mindfulness and meditation practice to help reduce stress and improve awareness.  Instructor also leads participants through a meditation exercise.    Stretching for Flexibility  and Mobility:  -Group instruction provided by verbal instruction, patient participation, and written materials to support subject.  Instructors lead participants through series of stretches that are designed to increase flexibility thus improving mobility.  These stretches are additional exercise for major muscle groups that are typically performed during regular warm up and cool down.   Hands Only CPR:  -Group verbal, video, and participation provides a basic overview of AHA guidelines for community CPR. Role-play of emergencies allow participants the opportunity to practice calling for help and chest compression technique with discussion of AED use.   Hypertension: -Group verbal and written instruction that provides a basic overview of hypertension including the most recent diagnostic guidelines, risk factor reduction with self-care instructions and  medication management.    Nutrition I class: Heart Healthy Eating:  -Group instruction provided by PowerPoint slides, verbal discussion, and written materials to support subject matter. The instructor gives an explanation and review of the Therapeutic Lifestyle Changes diet recommendations, which includes a discussion on lipid goals, dietary fat, sodium, fiber, plant stanol/sterol esters, sugar, and the components of a well-balanced, healthy diet.   Nutrition II class: Lifestyle Skills:  -Group instruction provided by PowerPoint slides, verbal discussion, and written materials to support subject matter. The instructor gives an explanation and review of label reading, grocery shopping for heart health, heart healthy recipe modifications, and ways to make healthier choices when eating out.   Diabetes Question & Answer:  -Group instruction provided by PowerPoint slides, verbal discussion, and written materials to support subject matter. The instructor gives an explanation and review of diabetes co-morbidities, pre- and post-prandial blood glucose goals, pre-exercise blood glucose goals, signs, symptoms, and treatment of hypoglycemia and hyperglycemia, and foot care basics.   Diabetes Blitz:  -Group instruction provided by PowerPoint slides, verbal discussion, and written materials to support subject matter. The instructor gives an explanation and review of the physiology behind type 1 and type 2 diabetes, diabetes medications and rational behind using different medications, pre- and post-prandial blood glucose recommendations and Hemoglobin A1c goals, diabetes diet, and exercise including blood glucose guidelines for exercising safely.    Portion Distortion:  -Group instruction provided by PowerPoint slides, verbal discussion, written materials, and food models to support subject matter. The instructor gives an explanation of serving size versus portion size, changes in portions sizes over the last  20 years, and what consists of a serving from each food group.   Stress Management:  -Group instruction provided by verbal instruction, video, and written materials to support subject matter.  Instructors review role of stress in heart disease and how to cope with stress positively.     Exercising on Your Own:  -Group instruction provided by verbal instruction, power point, and written materials to support subject.  Instructors discuss benefits of exercise, components of exercise, frequency and intensity of exercise, and end points for exercise.  Also discuss use of nitroglycerin and activating EMS.  Review options of places to exercise outside of rehab.  Review guidelines for sex with heart disease.   Cardiac Drugs I:  -Group instruction provided by verbal instruction and written materials to support subject.  Instructor reviews cardiac drug classes: antiplatelets, anticoagulants, beta blockers, and statins.  Instructor discusses reasons, side effects, and lifestyle considerations for each drug class.   Cardiac Drugs II:  -Group instruction provided by verbal instruction and written materials to support subject.  Instructor reviews cardiac drug classes: angiotensin converting enzyme inhibitors (ACE-I), angiotensin II receptor blockers (ARBs), nitrates, and calcium channel  blockers.  Instructor discusses reasons, side effects, and lifestyle considerations for each drug class.   Anatomy and Physiology of the Circulatory System:  Group verbal and written instruction and models provide basic cardiac anatomy and physiology, with the coronary electrical and arterial systems. Review of: AMI, Angina, Valve disease, Heart Failure, Peripheral Artery Disease, Cardiac Arrhythmia, Pacemakers, and the ICD.   Other Education:  -Group or individual verbal, written, or video instructions that support the educational goals of the cardiac rehab program.   Knowledge Questionnaire Score: Knowledge  Questionnaire Score - 10/04/17 1026      Knowledge Questionnaire Score   Pre Score  19/24       Core Components/Risk Factors/Patient Goals at Admission: Personal Goals and Risk Factors at Admission - 10/04/17 1207      Core Components/Risk Factors/Patient Goals on Admission    Weight Management  Yes;Weight Maintenance;Weight Loss    Intervention  Weight Management: Develop a combined nutrition and exercise program designed to reach desired caloric intake, while maintaining appropriate intake of nutrient and fiber, sodium and fats, and appropriate energy expenditure required for the weight goal.;Weight Management: Provide education and appropriate resources to help participant work on and attain dietary goals.;Weight Management/Obesity: Establish reasonable short term and long term weight goals.    Admit Weight  209 lb 3.5 oz (94.9 kg)    Goal Weight: Short Term  199 lb (90.3 kg)    Goal Weight: Long Term  195 lb (88.5 kg)    Expected Outcomes  Short Term: Continue to assess and modify interventions until short term weight is achieved;Long Term: Adherence to nutrition and physical activity/exercise program aimed toward attainment of established weight goal;Weight Maintenance: Understanding of the daily nutrition guidelines, which includes 25-35% calories from fat, 7% or less cal from saturated fats, less than 200mg  cholesterol, less than 1.5gm of sodium, & 5 or more servings of fruits and vegetables daily;Weight Loss: Understanding of general recommendations for a balanced deficit meal plan, which promotes 1-2 lb weight loss per week and includes a negative energy balance of [phone] kcal/d;Understanding of distribution of calorie intake throughout the day with the consumption of 4-5 meals/snacks;Understanding recommendations for meals to include 15-35% energy as protein, 25-35% energy from fat, 35-60% energy from carbohydrates, less than 200mg  of dietary cholesterol, 20-35 gm of total fiber daily     Hypertension  Yes    Intervention  Provide education on lifestyle modifcations including regular physical activity/exercise, weight management, moderate sodium restriction and increased consumption of fresh fruit, vegetables, and low fat dairy, alcohol moderation, and smoking cessation.;Monitor prescription use compliance.    Expected Outcomes  Short Term: Continued assessment and intervention until BP is < 140/62mm HG in hypertensive participants. < 130/76mm HG in hypertensive participants with diabetes, heart failure or chronic kidney disease.;Long Term: Maintenance of blood pressure at goal levels.    Lipids  Yes    Intervention  Provide education and support for participant on nutrition & aerobic/resistive exercise along with prescribed medications to achieve LDL 70mg , HDL >40mg .    Expected Outcomes  Short Term: Participant states understanding of desired cholesterol values and is compliant with medications prescribed. Participant is following exercise prescription and nutrition guidelines.;Long Term: Cholesterol controlled with medications as prescribed, with individualized exercise RX and with personalized nutrition plan. Value goals: LDL < 70mg , HDL > 40 mg.       Core Components/Risk Factors/Patient Goals Review:    Core Components/Risk Factors/Patient Goals at Discharge (Final Review):    ITP Comments: ITP  Comments    Row Name 10/04/17 0909           ITP Comments  Dr. Armanda Magic, Medical Director          Comments: Patient attended orientation from [phone]  to [phone]  to review rules and guidelines for program. Completed 6 minute walk test, Intitial ITP, and exercise prescription.  VSS. Telemetry-sinus rhythm, first degree AVB.  Asymptomatic.  Deveron Furlong, RN, BSN Cardiac Pulmonary Rehab

## 2017-10-05 NOTE — Progress Notes (Signed)
Jason Cabrera 72 y.o. male DOB: 04-06-1946 MRN: [id number]      Nutrition Note  1. ST elevation myocardial infarction involving right coronary artery (HCC)   2. Status post coronary artery stent placement    Past Medical History:  Diagnosis Date  . 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 Coronary artery disease    a. cath 07/19/17 critical in-stent restenosis/thrombosis in a large, dominant RCA s/p successfully PCI using a 4.0 x 23 mm Sierra DES.. Medical managment for mild non obstructive CAD  . Hypertension    Meds reviewed.   HT: Ht Readings from Last 1 Encounters:  10/04/17 5' 11.25" (1.81 m)    WT: Wt Readings from Last 3 Encounters:  10/04/17 209 lb 3.5 oz (94.9 kg)  08/03/17 210 lb (95.3 kg)  07/19/17 215 lb (97.5 kg)     BMI 28.9   Current tobacco use? No   Labs:  Lipid Panel     Component Value Date/Time   CHOL 143 07/20/2017 0203   TRIG 107 07/20/2017 0203   HDL 45 07/20/2017 0203   CHOLHDL 3.2 07/20/2017 0203   VLDL 21 07/20/2017 0203   LDLCALC 77 07/20/2017 0203    Lab Results  Component Value Date   HGBA1C 5.3 07/19/2017   CBG (last 3)  No results for input(s): GLUCAP in the last 72 hours.  Nutrition Note Spoke with pt. Nutrition plan and goals reviewed with pt. Pt is following Step 2 of the Therapeutic Lifestyle Changes diet. Pt wants to lose wt. Wt loss tips reviewed. Pt expressed understanding of the information reviewed. Pt aware of nutrition education classes offered and plans on attending nutrition classes.  Nutrition Diagnosis ? Food-and nutrition-related knowledge deficit related to lack of exposure to information as related to diagnosis of: ? CVD ? Overweight related to excessive energy intake as evidenced by a BMI of 28.9  Nutrition Intervention ? Pt's individual nutrition plan and goals reviewed with pt.  Nutrition Goal(s):  ? Pt to identify food quantities necessary to achieve weight loss of 6-15 lb at  graduation from cardiac rehab. Goal wt of 195 lb desired.   Plan:  Pt to attend nutrition classes ? Nutrition I ? Nutrition II ? Portion Distortion  Will provide client-centered nutrition education as part of interdisciplinary care.   Monitor and evaluate progress toward nutrition goal with team.  Mickle Plumb, M.Ed, RD, LDN, CDE 10/05/2017 10:08 AM

## 2017-10-10 ENCOUNTER — Encounter (HOSPITAL_COMMUNITY): Payer: Self-pay

## 2017-10-10 ENCOUNTER — Encounter (HOSPITAL_COMMUNITY)
Admission: RE | Admit: 2017-10-10 | Discharge: 2017-10-10 | Disposition: A | Discharge status: Home / Self Care | Payer: Non-veteran care | Source: Ambulatory Visit | Attending: Cardiovascular Disease | Admitting: Cardiovascular Disease

## 2017-10-10 DIAGNOSIS — Z955 Presence of coronary angioplasty implant and graft: Secondary | ICD-10-CM | POA: Diagnosis present

## 2017-10-10 DIAGNOSIS — I10 Essential (primary) hypertension: Secondary | ICD-10-CM | POA: Diagnosis not present

## 2017-10-10 DIAGNOSIS — Z79899 Other long term (current) drug therapy: Secondary | ICD-10-CM | POA: Diagnosis not present

## 2017-10-10 DIAGNOSIS — I2111 ST elevation (STEMI) myocardial infarction involving right coronary artery: Secondary | ICD-10-CM

## 2017-10-10 DIAGNOSIS — Z7982 Long term (current) use of aspirin: Secondary | ICD-10-CM | POA: Diagnosis not present

## 2017-10-10 DIAGNOSIS — I213 ST elevation (STEMI) myocardial infarction of unspecified site: Secondary | ICD-10-CM | POA: Diagnosis present

## 2017-10-10 DIAGNOSIS — I2510 Atherosclerotic heart disease of native coronary artery without angina pectoris: Secondary | ICD-10-CM | POA: Diagnosis not present

## 2017-10-10 NOTE — Progress Notes (Signed)
Daily Session Note  Patient Details  Name: Jason Cabrera MRN: [id number] Date of Birth: [birth year] Referring Provider:     [place] from 10/04/2017 in [place]  Referring Provider  Sherren Mocha MD      Encounter Date: 10/10/2017  Check In: Session Check In - 10/10/17 1408      Check-In   Location  [facility]    Staff Present  Luetta Nutting Fair, MS, ACSM RCEP, Exercise Physiologist;Kaileb Monsanto, RN, [facility], MS, ACSM RCEP, Exercise Physiologist    Supervising physician immediately available to respond to emergencies  [facility] immediately available    Physician(s)  Dr Cathlean Sauer    Medication changes reported      No    Fall or balance concerns reported     No    Tobacco Cessation  No Change    Warm-up and Cool-down  Performed as group-led instruction    Resistance Training Performed  Yes    VAD Patient?  No      Pain Assessment   Currently in Pain?  No/denies       Capillary Blood Glucose: No results found for this or any previous visit (from the past 24 hour(s)).    Social History   Tobacco Use  Smoking Status Never Smoker  Smokeless Tobacco Never Used    Goals Met:  Exercise tolerated well  Goals Unmet:  Not Applicable  Comments: Pt started cardiac rehab today.  Pt tolerated light exercise without difficulty. VSS, telemetry-sinus rhythm,  asymptomatic.  Medication list reconciled. Pt denies barriers to medicaiton compliance.  PSYCHOSOCIAL ASSESSMENT:  PHQ-1. Pt with known history of depression with PTSD.  Pt reports symptoms have improved and he is currently off zoloft.  Pt exhibits positive coping skills, hopeful outlook with supportive family. No psychosocial needs identified at this time, no psychosocial interventions necessary.    Pt enjoys spending time with his girlfriend.  Pt goals for CR are to lose weight and increase social interactions.   Pt oriented to exercise  equipment and routine.    Understanding verbalized.   Dr. Fransico Him is Medical Director for Cardiac Rehab at [facility].

## 2017-10-12 ENCOUNTER — Encounter (HOSPITAL_COMMUNITY)
Admission: RE | Admit: 2017-10-12 | Discharge: 2017-10-12 | Disposition: A | Discharge status: Home / Self Care | Payer: Non-veteran care | Source: Ambulatory Visit | Attending: Cardiovascular Disease | Admitting: Cardiovascular Disease

## 2017-10-12 DIAGNOSIS — I2111 ST elevation (STEMI) myocardial infarction involving right coronary artery: Secondary | ICD-10-CM | POA: Diagnosis not present

## 2017-10-12 DIAGNOSIS — Z955 Presence of coronary angioplasty implant and graft: Secondary | ICD-10-CM

## 2017-10-12 NOTE — Progress Notes (Signed)
Cardiac Individual Treatment Plan  Patient Details  Name: Jason Cabrera MRN: [id number] Date of Birth: 1945-10-24 Referring Provider:     CARDIAC REHAB PHASE II ORIENTATION from 10/04/2017 in [facility] CARDIAC [facility]  Referring Provider  Tonny Bollman MD      Initial Encounter Date:    CARDIAC REHAB PHASE II ORIENTATION from 10/04/2017 in [facility] CARDIAC REHAB  Date  10/04/17  Referring Provider  Tonny Bollman MD      Visit Diagnosis: ST elevation myocardial infarction involving right coronary artery [facility])  Status post coronary artery stent placement  Patient's Home Medications on Admission:  Current Outpatient Medications:  .  aspirin EC 81 MG tablet, Take 81 mg by mouth daily., Disp: , Rfl:  .  atorvastatin (LIPITOR) 80 MG tablet, Take 1 tablet (80 mg total) by mouth daily at 6 PM., Disp: 30 tablet, Rfl: 6 .  hydrOXYzine (VISTARIL) 25 MG capsule, Take 25 mg by mouth daily as needed for anxiety., Disp: , Rfl:  .  lisinopril (PRINIVIL,ZESTRIL) 10 MG tablet, Take 1 tablet (10 mg total) by mouth daily., Disp: 30 tablet, Rfl: 6 .  metoprolol tartrate (LOPRESSOR) 50 MG tablet, Take 50 mg by mouth 2 (two) times daily., Disp: , Rfl:  .  nitroGLYCERIN (NITROSTAT) 0.4 MG SL tablet, Place 1 tablet (0.4 mg total) under the tongue every 5 (five) minutes x 3 doses as needed for chest pain., Disp: 25 tablet, Rfl: 12 .  rOPINIRole (REQUIP) 0.5 MG tablet, Take 0.5 mg by mouth at bedtime., Disp: , Rfl:  .  sertraline (ZOLOFT) 100 MG tablet, Take 50 mg by mouth every morning., Disp: , Rfl:  .  tadalafil (CIALIS) 10 MG tablet, Take 10 mg by mouth daily as needed for erectile dysfunction., Disp: , Rfl:  .  ticagrelor (BRILINTA) 90 MG TABS tablet, Take 1 tablet (90 mg total) by mouth 2 (two) times daily. For 30 days free supply, Disp: 60 tablet, Rfl: 0 .  traZODone (DESYREL) 50 MG tablet, Take 50 mg by mouth at bedtime as needed for sleep., Disp: , Rfl:    Past Medical History: Past Medical History:  Diagnosis Date  . 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 Coronary artery disease    a. cath 07/19/17 critical in-stent restenosis/thrombosis in a large, dominant RCA s/p successfully PCI using a 4.0 x 23 mm Sierra DES.. Medical managment for mild non obstructive CAD  . Hypertension     Tobacco Use: Social History   Tobacco Use  Smoking Status Never Smoker  Smokeless Tobacco Never Used    Labs: Recent Review Advice worker    Labs for ITP Cardiac and Pulmonary Rehab Latest Ref Rng & Units 07/19/2017 07/20/2017   Cholestrol 0 - 200 mg/dL [phone]   LDLCALC 0 - 99 mg/dL 79 77   HDL >21 mg/dL 30(Q) 45   Trlycerides <150 mg/dL 657 846   Hemoglobin N6E 4.8 - 5.6 % 5.3 -   TCO2 22 - 32 mmol/L 24 -      Capillary Blood Glucose: No results found for: GLUCAP   Exercise Target Goals:    Exercise Program Goal: Individual exercise prescription set using results from initial 6 min walk test and THRR while considering  patient's activity barriers and safety.   Exercise Prescription Goal: Initial exercise prescription builds to 30-45 minutes a day of aerobic activity, 2-3 days per week.  Home exercise guidelines will be given to patient during program as part  of exercise prescription that the participant will acknowledge.  Activity Barriers & Risk Stratification: Activity Barriers & Cardiac Risk Stratification - 10/04/17 1155      Activity Barriers & Cardiac Risk Stratification   Activity Barriers  Muscular Weakness;Deconditioning    Cardiac Risk Stratification  High       6 Minute Walk: 6 Minute Walk    Row Name 10/04/17 1154         6 Minute Walk   Phase  Initial     Distance  1870 feet     Walk Time  6 minutes     # of Rest Breaks  0     MPH  3.54     METS  3.87     RPE  11     VO2 Peak  13.57     Symptoms  No     Resting HR  69 bpm     Resting BP  132/70     Resting Oxygen Saturation   99  %     Exercise Oxygen Saturation  during 6 min walk  100 %     Max Ex. HR  87 bpm     Max Ex. BP  144/80     2 Minute Post BP  108/60        Oxygen Initial Assessment:   Oxygen Re-Evaluation:   Oxygen Discharge (Final Oxygen Re-Evaluation):   Initial Exercise Prescription: Initial Exercise Prescription - 10/04/17 1100      Date of Initial Exercise RX and Referring Provider   Date  10/04/17    Referring Provider  Tonny Bollman MD      Treadmill   MPH  3    Grade  0    Minutes  15    METs  3.3      Bike   Level  1    Minutes  15    METs  2.99      NuStep   Level  3    SPM  80    Minutes  15    METs  2.2      Prescription Details   Frequency (times per week)  3    Duration  Progress to 30 minutes of continuous aerobic without signs/symptoms of physical distress      Intensity   THRR 40-80% of Max Heartrate  60-121    Ratings of Perceived Exertion  11-15    Perceived Dyspnea  0-4      Progression   Progression  Continue to progress workloads to maintain intensity without signs/symptoms of physical distress.      Resistance Training   Training Prescription  Yes    Weight  3lbs    Reps  10-15       Perform Capillary Blood Glucose checks as needed.  Exercise Prescription Changes: Exercise Prescription Changes    Row Name 10/10/17 1247             Response to Exercise   Blood Pressure (Admit)  140/74       Blood Pressure (Exercise)  164/70       Blood Pressure (Exit)  120/70       Heart Rate (Admit)  66 bpm       Heart Rate (Exercise)  103 bpm       Heart Rate (Exit)  74 bpm       Rating of Perceived Exertion (Exercise)  13       Symptoms  none  Comments  pt oriented to exercise equipment today       Duration  Continue with 30 min of aerobic exercise without signs/symptoms of physical distress.       Intensity  THRR unchanged         Progression   Progression  Continue to progress workloads to maintain intensity without signs/symptoms  of physical distress.       Average METs  3.1         Resistance Training   Training Prescription  Yes       Weight  3lbs       Reps  10-15       Time  10 Minutes         Treadmill   MPH  3       Grade  0       Minutes  15       METs  3.3         Bike   Level  1       Minutes  15       METs  2.99          Exercise Comments: Exercise Comments    Row Name 10/10/17 1248           Exercise Comments  Pt was oriented to exercise equipment on 10/10/17. Pt was able to exercise safelty for 30 minutes without any sign/symptoms of physical distress. Willl progress pt's activity level as tolerated.          Exercise Goals and Review: Exercise Goals    Row Name 10/04/17 0910             Exercise Goals   Increase Physical Activity  Yes       Intervention  Develop an individualized exercise prescription for aerobic and resistive training based on initial evaluation findings, risk stratification, comorbidities and participant's personal goals.;Provide advice, education, support and counseling about physical activity/exercise needs.       Expected Outcomes  Achievement of increased cardiorespiratory fitness and enhanced flexibility, muscular endurance and strength shown through measurements of functional capacity and personal statement of participant.       Increase Strength and Stamina  Yes       Intervention  Provide advice, education, support and counseling about physical activity/exercise needs.;Develop an individualized exercise prescription for aerobic and resistive training based on initial evaluation findings, risk stratification, comorbidities and participant's personal goals.       Expected Outcomes  Achievement of increased cardiorespiratory fitness and enhanced flexibility, muscular endurance and strength shown through measurements of functional capacity and personal statement of participant.       Able to understand and use rate of perceived exertion (RPE) scale  Yes        Intervention  Provide education and explanation on how to use RPE scale       Expected Outcomes  Short Term: Able to use RPE daily in rehab to express subjective intensity level;Long Term:  Able to use RPE to guide intensity level when exercising independently       Knowledge and understanding of Target Heart Rate Range (THRR)  Yes       Intervention  Provide education and explanation of THRR including how the numbers were predicted and where they are located for reference       Expected Outcomes  Short Term: Able to state/look up THRR;Long Term: Able to use THRR to govern intensity when exercising independently;Short Term: Able to use  daily as guideline for intensity in rehab       Able to check pulse independently  Yes       Intervention  Provide education and demonstration on how to check pulse in carotid and radial arteries.;Review the importance of being able to check your own pulse for safety during independent exercise       Expected Outcomes  Short Term: Able to explain why pulse checking is important during independent exercise;Long Term: Able to check pulse independently and accurately       Understanding of Exercise Prescription  Yes       Intervention  Provide education, explanation, and written materials on patient's individual exercise prescription       Expected Outcomes  Short Term: Able to explain program exercise prescription;Long Term: Able to explain home exercise prescription to exercise independently          Exercise Goals Re-Evaluation : Exercise Goals Re-Evaluation    Row Name 10/12/17 1250             Exercise Goal Re-Evaluation   Exercise Goals Review  Increase Physical Activity;Understanding of Exercise Prescription;Able to understand and use rate of perceived exertion (RPE) scale       Comments  Pt demonstrated proper use of RPE scale and has great understanding of exercise flow and order. Pt will continue to exercise for 30 minutes symptom free.       Expected  Outcomes  Pt will continue to improve in cardiorespiratory fitness, functional capacity and overall well being           Discharge Exercise Prescription (Final Exercise Prescription Changes): Exercise Prescription Changes - 10/10/17 1247      Response to Exercise   Blood Pressure (Admit)  140/74    Blood Pressure (Exercise)  164/70    Blood Pressure (Exit)  120/70    Heart Rate (Admit)  66 bpm    Heart Rate (Exercise)  103 bpm    Heart Rate (Exit)  74 bpm    Rating of Perceived Exertion (Exercise)  13    Symptoms  none    Comments  pt oriented to exercise equipment today    Duration  Continue with 30 min of aerobic exercise without signs/symptoms of physical distress.    Intensity  THRR unchanged      Progression   Progression  Continue to progress workloads to maintain intensity without signs/symptoms of physical distress.    Average METs  3.1      Resistance Training   Training Prescription  Yes    Weight  3lbs    Reps  10-15    Time  10 Minutes      Treadmill   MPH  3    Grade  0    Minutes  15    METs  3.3      Bike   Level  1    Minutes  15    METs  2.99       Nutrition:  Target Goals: Understanding of nutrition guidelines, daily intake of sodium 1500mg , cholesterol 200mg , calories 30% from fat and 7% or less from saturated fats, daily to have 5 or more servings of fruits and vegetables.  Biometrics: Pre Biometrics - 10/04/17 1206      Pre Biometrics   Height  5' 11.25" (1.81 m)    Weight  209 lb 3.5 oz (94.9 kg)    Waist Circumference  41 inches    Hip Circumference  43.25 inches  Waist to Hip Ratio  0.95 %    BMI (Calculated)  28.97    Triceps Skinfold  15 mm    % Body Fat  28.2 %    Grip Strength  44 kg    Flexibility  12 in    Single Leg Stand  27.69 seconds        Nutrition Therapy Plan and Nutrition Goals: Nutrition Therapy & Goals - 10/05/17 1147      Nutrition Therapy   Diet  Heart Healthy      Personal Nutrition Goals    Nutrition Goal  Pt to identify food quantities necessary to achieve weight loss of 6-15 lb at graduation from cardiac rehab. Goal wt of 195 lb desired.       Intervention Plan   Intervention  Prescribe, educate and counsel regarding individualized specific dietary modifications aiming towards targeted core components such as weight, hypertension, lipid management, diabetes, heart failure and other comorbidities.    Expected Outcomes  Short Term Goal: Understand basic principles of dietary content, such as calories, fat, sodium, cholesterol and nutrients.;Long Term Goal: Adherence to prescribed nutrition plan.       Nutrition Assessments: Nutrition Assessments - 10/05/17 1147      MEDFICTS Scores   Pre Score  29       Nutrition Goals Re-Evaluation:   Nutrition Goals Re-Evaluation:   Nutrition Goals Discharge (Final Nutrition Goals Re-Evaluation):   Psychosocial: Target Goals: Acknowledge presence or absence of significant depression and/or stress, maximize coping skills, provide positive support system. Participant is able to verbalize types and ability to use techniques and skills needed for reducing stress and depression.  Initial Review & Psychosocial Screening: Initial Psych Review & Screening - 10/04/17 1027      Initial Review   Current issues with  None Identified      Family Dynamics   Good Support System?  Yes  (Significant)  Friend     Comments  no psychosocial needs identified, no interventions necessary       Barriers   Psychosocial barriers to participate in program  There are no identifiable barriers or psychosocial needs.      Screening Interventions   Interventions  Encouraged to exercise       Quality of Life Scores: Quality of Life - 10/04/17 1028      Quality of Life Scores   Health/Function Pre  25.73 %    Socioeconomic Pre  28.44 %    Psych/Spiritual Pre  24.21 %    Family Pre  27.1 %    GLOBAL Pre  26.24 %      Scores of 19 and below  usually indicate a poorer quality of life in these areas.  A difference of  2-3 points is a clinically meaningful difference.  A difference of 2-3 points in the total score of the Quality of Life Index has been associated with significant improvement in overall quality of life, self-image, physical symptoms, and general health in studies assessing change in quality of life.  PHQ-9: Recent Review Flowsheet Data    Depression screen [facility] 2/9 10/10/2017   Decreased Interest 0   Down, Depressed, Hopeless 1   PHQ - 2 Score 1     Interpretation of Total Score  Total Score Depression Severity:  1-4 = Minimal depression, 5-9 = Mild depression, 10-14 = Moderate depression, 15-19 = Moderately severe depression, 20-27 = Severe depression   Psychosocial Evaluation and Intervention: Psychosocial Evaluation - 10/10/17 1442  Psychosocial Evaluation & Interventions   Interventions  Encouraged to exercise with the program and follow exercise prescription;Stress management education;Relaxation education    Comments  pt with known history of depression, anxiety and PTSD ([place] Vet)  verbalizes improvement in his symptoms over past year. pt reports he has been off zoloft for 1 year.  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      Expected Outcomes  pt will exhibit positive outlook with good coping skills.     Continue Psychosocial Services   Follow up required by staff       Psychosocial Re-Evaluation:   Psychosocial Discharge (Final Psychosocial Re-Evaluation):   Vocational Rehabilitation: Provide vocational rehab assistance to qualifying candidates.   Vocational Rehab Evaluation & Intervention: Vocational Rehab - 10/04/17 1027      Initial Vocational Rehab Evaluation & Intervention   Assessment shows need for Vocational Rehabilitation  No       Education: Education Goals: Education classes will be provided on a weekly  basis, covering required topics. Participant will state understanding/return demonstration of topics presented.  Learning Barriers/Preferences: Learning Barriers/Preferences - 10/04/17 0911      Learning Barriers/Preferences   Learning Barriers  Sight    Learning Preferences  Verbal Instruction;Video;Written Material;Pictoral;Skilled Demonstration       Education Topics: Count Your Pulse:  -Group instruction provided by verbal instruction, demonstration, patient participation and written materials to support subject.  Instructors address importance of being able to find your pulse and how to count your pulse when at home without a heart monitor.  Patients get hands on experience counting their pulse with staff help and individually.   Heart Attack, Angina, and Risk Factor Modification:  -Group instruction provided by verbal instruction, video, and written materials to support subject.  Instructors address signs and symptoms of angina and heart attacks.    Also discuss risk factors for heart disease and how to make changes to improve heart health risk factors.   Functional Fitness:  -Group instruction provided by verbal instruction, demonstration, patient participation, and written materials to support subject.  Instructors address safety measures for doing things around the house.  Discuss how to get up and down off the floor, how to pick things up properly, how to safely get out of a chair without assistance, and balance training.   Meditation and Mindfulness:  -Group instruction provided by verbal instruction, patient participation, and written materials to support subject.  Instructor addresses importance of mindfulness and meditation practice to help reduce stress and improve awareness.  Instructor also leads participants through a meditation exercise.    Stretching for Flexibility and Mobility:  -Group instruction provided by verbal instruction, patient participation, and written  materials to support subject.  Instructors lead participants through series of stretches that are designed to increase flexibility thus improving mobility.  These stretches are additional exercise for major muscle groups that are typically performed during regular warm up and cool down.   Hands Only CPR:  -Group verbal, video, and participation provides a basic overview of AHA guidelines for community CPR. Role-play of emergencies allow participants the opportunity to practice calling for help and chest compression technique with discussion of AED use.   Hypertension: -Group verbal and written instruction that provides a basic overview of hypertension including the most recent diagnostic guidelines, risk factor reduction with self-care instructions and medication management.    Nutrition I class: Heart Healthy Eating:  -Group instruction provided by PowerPoint  slides, verbal discussion, and written materials to support subject matter. The instructor gives an explanation and review of the Therapeutic Lifestyle Changes diet recommendations, which includes a discussion on lipid goals, dietary fat, sodium, fiber, plant stanol/sterol esters, sugar, and the components of a well-balanced, healthy diet.   Nutrition II class: Lifestyle Skills:  -Group instruction provided by PowerPoint slides, verbal discussion, and written materials to support subject matter. The instructor gives an explanation and review of label reading, grocery shopping for heart health, heart healthy recipe modifications, and ways to make healthier choices when eating out.   Diabetes Question & Answer:  -Group instruction provided by PowerPoint slides, verbal discussion, and written materials to support subject matter. The instructor gives an explanation and review of diabetes co-morbidities, pre- and post-prandial blood glucose goals, pre-exercise blood glucose goals, signs, symptoms, and treatment of hypoglycemia and  hyperglycemia, and foot care basics.   Diabetes Blitz:  -Group instruction provided by PowerPoint slides, verbal discussion, and written materials to support subject matter. The instructor gives an explanation and review of the physiology behind type 1 and type 2 diabetes, diabetes medications and rational behind using different medications, pre- and post-prandial blood glucose recommendations and Hemoglobin A1c goals, diabetes diet, and exercise including blood glucose guidelines for exercising safely.    Portion Distortion:  -Group instruction provided by PowerPoint slides, verbal discussion, written materials, and food models to support subject matter. The instructor gives an explanation of serving size versus portion size, changes in portions sizes over the last 20 years, and what consists of a serving from each food group.   Stress Management:  -Group instruction provided by verbal instruction, video, and written materials to support subject matter.  Instructors review role of stress in heart disease and how to cope with stress positively.     Exercising on Your Own:  -Group instruction provided by verbal instruction, power point, and written materials to support subject.  Instructors discuss benefits of exercise, components of exercise, frequency and intensity of exercise, and end points for exercise.  Also discuss use of nitroglycerin and activating EMS.  Review options of places to exercise outside of rehab.  Review guidelines for sex with heart disease.   Cardiac Drugs I:  -Group instruction provided by verbal instruction and written materials to support subject.  Instructor reviews cardiac drug classes: antiplatelets, anticoagulants, beta blockers, and statins.  Instructor discusses reasons, side effects, and lifestyle considerations for each drug class.   Cardiac Drugs II:  -Group instruction provided by verbal instruction and written materials to support subject.  Instructor  reviews cardiac drug classes: angiotensin converting enzyme inhibitors (ACE-I), angiotensin II receptor blockers (ARBs), nitrates, and calcium channel blockers.  Instructor discusses reasons, side effects, and lifestyle considerations for each drug class.   Anatomy and Physiology of the Circulatory System:  Group verbal and written instruction and models provide basic cardiac anatomy and physiology, with the coronary electrical and arterial systems. Review of: AMI, Angina, Valve disease, Heart Failure, Peripheral Artery Disease, Cardiac Arrhythmia, Pacemakers, and the ICD.   Other Education:  -Group or individual verbal, written, or video instructions that support the educational goals of the cardiac rehab program.   Knowledge Questionnaire Score: Knowledge Questionnaire Score - 10/04/17 1026      Knowledge Questionnaire Score   Pre Score  19/24       Core Components/Risk Factors/Patient Goals at Admission: Personal Goals and Risk Factors at Admission - 10/04/17 1207      Core Components/Risk Factors/Patient Goals on Admission  Weight Management  Yes;Weight Maintenance;Weight Loss    Intervention  Weight Management: Develop a combined nutrition and exercise program designed to reach desired caloric intake, while maintaining appropriate intake of nutrient and fiber, sodium and fats, and appropriate energy expenditure required for the weight goal.;Weight Management: Provide education and appropriate resources to help participant work on and attain dietary goals.;Weight Management/Obesity: Establish reasonable short term and long term weight goals.    Admit Weight  209 lb 3.5 oz (94.9 kg)    Goal Weight: Short Term  199 lb (90.3 kg)    Goal Weight: Long Term  195 lb (88.5 kg)    Expected Outcomes  Short Term: Continue to assess and modify interventions until short term weight is achieved;Long Term: Adherence to nutrition and physical activity/exercise program aimed toward attainment of  established weight goal;Weight Maintenance: Understanding of the daily nutrition guidelines, which includes 25-35% calories from fat, 7% or less cal from saturated fats, less than 200mg  cholesterol, less than 1.5gm of sodium, & 5 or more servings of fruits and vegetables daily;Weight Loss: Understanding of general recommendations for a balanced deficit meal plan, which promotes 1-2 lb weight loss per week and includes a negative energy balance of [phone] kcal/d;Understanding of distribution of calorie intake throughout the day with the consumption of 4-5 meals/snacks;Understanding recommendations for meals to include 15-35% energy as protein, 25-35% energy from fat, 35-60% energy from carbohydrates, less than 200mg  of dietary cholesterol, 20-35 gm of total fiber daily    Hypertension  Yes    Intervention  Provide education on lifestyle modifcations including regular physical activity/exercise, weight management, moderate sodium restriction and increased consumption of fresh fruit, vegetables, and low fat dairy, alcohol moderation, and smoking cessation.;Monitor prescription use compliance.    Expected Outcomes  Short Term: Continued assessment and intervention until BP is < 140/76mm HG in hypertensive participants. < 130/47mm HG in hypertensive participants with diabetes, heart failure or chronic kidney disease.;Long Term: Maintenance of blood pressure at goal levels.    Lipids  Yes    Intervention  Provide education and support for participant on nutrition & aerobic/resistive exercise along with prescribed medications to achieve LDL 70mg , HDL >40mg .    Expected Outcomes  Short Term: Participant states understanding of desired cholesterol values and is compliant with medications prescribed. Participant is following exercise prescription and nutrition guidelines.;Long Term: Cholesterol controlled with medications as prescribed, with individualized exercise RX and with personalized nutrition plan. Value goals:  LDL < 70mg , HDL > 40 mg.       Core Components/Risk Factors/Patient Goals Review:  Goals and Risk Factor Review    Row Name 10/10/17 1440             Core Components/Risk Factors/Patient Goals Review   Personal Goals Review  Weight Management/Obesity;Lipids;Hypertension       Review  pt with CAD RF demonstrates eagerness to participate in CR program. pt personal goal is to lose 10 lbs and increase social interaction.        Expected Outcomes  pt will participate in CR exercise, nutitrition and lifestyle modification to decrease overall RF.            Core Components/Risk Factors/Patient Goals at Discharge (Final Review):  Goals and Risk Factor Review - 10/10/17 1440      Core Components/Risk Factors/Patient Goals Review   Personal Goals Review  Weight Management/Obesity;Lipids;Hypertension    Review  pt with CAD RF demonstrates eagerness to participate in CR program. pt personal goal is to lose 10  lbs and increase social interaction.     Expected Outcomes  pt will participate in CR exercise, nutitrition and lifestyle modification to decrease overall RF.         ITP Comments: ITP Comments    Row Name 10/04/17 0909 10/10/17 1437         ITP Comments  Dr. Armanda Magic, Medical Director  pt started group exercise sessions today. pt tolerated light activity without difficulty.          Comments:

## 2017-10-14 ENCOUNTER — Encounter (HOSPITAL_COMMUNITY)
Admission: RE | Admit: 2017-10-14 | Discharge: 2017-10-14 | Disposition: A | Discharge status: Home / Self Care | Payer: Non-veteran care | Source: Ambulatory Visit | Attending: Cardiovascular Disease | Admitting: Cardiovascular Disease

## 2017-10-14 DIAGNOSIS — Z955 Presence of coronary angioplasty implant and graft: Secondary | ICD-10-CM

## 2017-10-14 DIAGNOSIS — I2111 ST elevation (STEMI) myocardial infarction involving right coronary artery: Secondary | ICD-10-CM

## 2017-10-14 NOTE — Progress Notes (Signed)
Jason Cabrera 72 y.o. male DOB: 28-Dec-1945 MRN: [id number]      Nutrition Note  1. ST elevation myocardial infarction involving right coronary artery (HCC)   2. Status post coronary artery stent placement    Nutrition Note Spoke with pt. Nutrition plan and survey reviewed with pt. Pt is following a Heart Healthy diet. Pt wants to lose wt. Pt has lost ~1 lb since admission. Wt loss tips reviewed. Pt expressed understanding of the information reviewed. Pt aware of nutrition education classes offered and plans on attending nutrition classes.  Nutrition Diagnosis ? Food-and nutrition-related knowledge deficit related to lack of exposure to information as related to diagnosis of: ? CVD ? Overweight related to excessive energy intake as evidenced by a BMI of 28.9  Nutrition Intervention ? Pt's individual nutrition plan reviewed with pt. ? Benefits of adopting Heart Healthy diet discussed when Medficts reviewed.    Nutrition Goal(s):  ? Pt to identify food quantities necessary to achieve weight loss of 6-15 lb at graduation from cardiac rehab. Goal wt of 195 lb desired.   Plan:  Pt to attend nutrition classes ? Nutrition I ? Nutrition II ? Portion Distortion  Will provide client-centered nutrition education as part of interdisciplinary care.   Monitor and evaluate progress toward nutrition goal with team.  Mickle Plumb, M.Ed, RD, LDN, CDE 10/14/2017 2:22 PM

## 2017-10-17 ENCOUNTER — Encounter (HOSPITAL_COMMUNITY)
Admission: RE | Admit: 2017-10-17 | Discharge: 2017-10-17 | Disposition: A | Discharge status: Home / Self Care | Payer: Non-veteran care | Source: Ambulatory Visit | Attending: Cardiovascular Disease | Admitting: Cardiovascular Disease

## 2017-10-17 DIAGNOSIS — I2111 ST elevation (STEMI) myocardial infarction involving right coronary artery: Secondary | ICD-10-CM | POA: Diagnosis not present

## 2017-10-17 NOTE — Progress Notes (Signed)
Reviewed home exercise with pt today.  Pt plans to walk for exercise, 2-3x/week in addition to coming to cardiac rehab.  Reviewed THR, pulse, RPE, sign and symptoms, NTG use, and when to call 911 or MD.  Also discussed weather considerations and indoor options.  Pt voiced understanding.    Jillayne Witte,MS,ACSM RCEP 

## 2017-10-19 ENCOUNTER — Encounter (HOSPITAL_COMMUNITY)
Admission: RE | Admit: 2017-10-19 | Discharge: 2017-10-19 | Disposition: A | Discharge status: Home / Self Care | Payer: Non-veteran care | Source: Ambulatory Visit | Attending: Cardiovascular Disease | Admitting: Cardiovascular Disease

## 2017-10-19 DIAGNOSIS — I2111 ST elevation (STEMI) myocardial infarction involving right coronary artery: Secondary | ICD-10-CM

## 2017-10-19 DIAGNOSIS — Z955 Presence of coronary angioplasty implant and graft: Secondary | ICD-10-CM

## 2017-10-21 ENCOUNTER — Encounter (HOSPITAL_COMMUNITY)
Admission: RE | Admit: 2017-10-21 | Discharge: 2017-10-21 | Disposition: A | Discharge status: Home / Self Care | Payer: Non-veteran care | Source: Ambulatory Visit | Attending: Cardiovascular Disease | Admitting: Cardiovascular Disease

## 2017-10-21 DIAGNOSIS — Z79899 Other long term (current) drug therapy: Secondary | ICD-10-CM | POA: Insufficient documentation

## 2017-10-21 DIAGNOSIS — I10 Essential (primary) hypertension: Secondary | ICD-10-CM | POA: Diagnosis not present

## 2017-10-21 DIAGNOSIS — Z955 Presence of coronary angioplasty implant and graft: Secondary | ICD-10-CM | POA: Diagnosis present

## 2017-10-21 DIAGNOSIS — Z7982 Long term (current) use of aspirin: Secondary | ICD-10-CM | POA: Insufficient documentation

## 2017-10-21 DIAGNOSIS — I2111 ST elevation (STEMI) myocardial infarction involving right coronary artery: Secondary | ICD-10-CM

## 2017-10-21 DIAGNOSIS — I2510 Atherosclerotic heart disease of native coronary artery without angina pectoris: Secondary | ICD-10-CM | POA: Insufficient documentation

## 2017-10-21 DIAGNOSIS — I213 ST elevation (STEMI) myocardial infarction of unspecified site: Secondary | ICD-10-CM | POA: Diagnosis present

## 2017-10-24 ENCOUNTER — Encounter (HOSPITAL_COMMUNITY)
Admission: RE | Admit: 2017-10-24 | Discharge: 2017-10-24 | Disposition: A | Discharge status: Home / Self Care | Payer: Non-veteran care | Source: Ambulatory Visit | Attending: Cardiovascular Disease | Admitting: Cardiovascular Disease

## 2017-10-24 DIAGNOSIS — I2111 ST elevation (STEMI) myocardial infarction involving right coronary artery: Secondary | ICD-10-CM | POA: Diagnosis not present

## 2017-10-24 DIAGNOSIS — Z955 Presence of coronary angioplasty implant and graft: Secondary | ICD-10-CM

## 2017-10-26 ENCOUNTER — Encounter (HOSPITAL_COMMUNITY)
Admission: RE | Admit: 2017-10-26 | Discharge: 2017-10-26 | Disposition: A | Discharge status: Home / Self Care | Payer: Non-veteran care | Source: Ambulatory Visit | Attending: Cardiovascular Disease | Admitting: Cardiovascular Disease

## 2017-10-26 DIAGNOSIS — Z955 Presence of coronary angioplasty implant and graft: Secondary | ICD-10-CM

## 2017-10-26 DIAGNOSIS — I2111 ST elevation (STEMI) myocardial infarction involving right coronary artery: Secondary | ICD-10-CM

## 2017-10-28 ENCOUNTER — Encounter (HOSPITAL_COMMUNITY)
Admission: RE | Admit: 2017-10-28 | Discharge: 2017-10-28 | Disposition: A | Discharge status: Home / Self Care | Payer: Non-veteran care | Source: Ambulatory Visit | Attending: Cardiovascular Disease | Admitting: Cardiovascular Disease

## 2017-10-28 DIAGNOSIS — I2111 ST elevation (STEMI) myocardial infarction involving right coronary artery: Secondary | ICD-10-CM | POA: Diagnosis not present

## 2017-10-28 DIAGNOSIS — Z955 Presence of coronary angioplasty implant and graft: Secondary | ICD-10-CM

## 2017-10-31 ENCOUNTER — Encounter (HOSPITAL_COMMUNITY)
Admission: RE | Admit: 2017-10-31 | Discharge: 2017-10-31 | Disposition: A | Discharge status: Home / Self Care | Payer: Non-veteran care | Source: Ambulatory Visit | Attending: Cardiovascular Disease | Admitting: Cardiovascular Disease

## 2017-10-31 DIAGNOSIS — Z955 Presence of coronary angioplasty implant and graft: Secondary | ICD-10-CM

## 2017-10-31 DIAGNOSIS — I2111 ST elevation (STEMI) myocardial infarction involving right coronary artery: Secondary | ICD-10-CM | POA: Diagnosis not present

## 2017-11-02 ENCOUNTER — Encounter (HOSPITAL_COMMUNITY)
Admission: RE | Admit: 2017-11-02 | Discharge: 2017-11-02 | Disposition: A | Discharge status: Home / Self Care | Payer: Non-veteran care | Source: Ambulatory Visit | Attending: Cardiovascular Disease | Admitting: Cardiovascular Disease

## 2017-11-02 DIAGNOSIS — I2111 ST elevation (STEMI) myocardial infarction involving right coronary artery: Secondary | ICD-10-CM | POA: Diagnosis not present

## 2017-11-02 DIAGNOSIS — Z955 Presence of coronary angioplasty implant and graft: Secondary | ICD-10-CM

## 2017-11-04 ENCOUNTER — Ambulatory Visit: Payer: Medicare PPO | Admitting: Cardiovascular Disease

## 2017-11-04 ENCOUNTER — Encounter (INDEPENDENT_AMBULATORY_CARE_PROVIDER_SITE_OTHER): Payer: Self-pay

## 2017-11-04 ENCOUNTER — Encounter: Payer: Self-pay | Admitting: Cardiovascular Disease

## 2017-11-04 ENCOUNTER — Encounter (HOSPITAL_COMMUNITY)
Admission: RE | Admit: 2017-11-04 | Discharge: 2017-11-04 | Disposition: A | Discharge status: Home / Self Care | Payer: Non-veteran care | Source: Ambulatory Visit | Attending: Cardiovascular Disease | Admitting: Cardiovascular Disease

## 2017-11-04 VITALS — BP 144/70 | HR 66 | Ht 72.0 in | Wt 207.1 lb

## 2017-11-04 DIAGNOSIS — I2111 ST elevation (STEMI) myocardial infarction involving right coronary artery: Secondary | ICD-10-CM | POA: Diagnosis not present

## 2017-11-04 DIAGNOSIS — E785 Hyperlipidemia, unspecified: Secondary | ICD-10-CM

## 2017-11-04 DIAGNOSIS — I2510 Atherosclerotic heart disease of native coronary artery without angina pectoris: Secondary | ICD-10-CM | POA: Diagnosis not present

## 2017-11-04 DIAGNOSIS — I10 Essential (primary) hypertension: Secondary | ICD-10-CM | POA: Diagnosis not present

## 2017-11-04 DIAGNOSIS — Z955 Presence of coronary angioplasty implant and graft: Secondary | ICD-10-CM

## 2017-11-04 NOTE — Patient Instructions (Signed)
Medication Instructions:  Your provider recommends that you continue on your current medications as directed. Please refer to the Current Medication list given to you today.    Labwork: None  Testing/Procedures: None  Follow-Up: Your provider wants you to follow-up in October, 2019. You will receive a reminder letter in the mail two months in advance. If you don't receive a letter, please call our office to schedule the follow-up appointment.    Any Other Special Instructions Will Be Listed Below (If Applicable).     If you need a refill on your cardiac medications before your next appointment, please call your pharmacy.

## 2017-11-04 NOTE — Progress Notes (Signed)
Cardiology Office Note Date:  11/04/2017   ID:  [name], [name] Jan 24, 1946, MRN [id number]  PCP:  System, Pcp Not In  Cardiologist:  Tonny Bollman, MD    Chief Complaint  Patient presents with  . Follow-up    CAD - MI     History of Present Illness: Jason Cabrera is a 72 y.o. male who presents for follow-up of coronary artery disease.  The patient has a history of remote CAD with RCA angioplasty and stenting in 1996.  He presented July 19, 2017 with an inferior STEMI secondary to critical in-stent restenosis with thrombosis.  He was treated with PCI using a 4.0 x 23 mm Sierra DES.  His echo showed normal LV function and his cardiac enzymes were only mildly elevated.  He was last seen here in follow-up August 03, 2017 at which time he was doing well.  He has continued with cardiac rehab.  He's here alone today. Feeling very well - cardiac rehab has really helped him. Didn't realize how bad he felt prior to PCI and now feels like he's getting in better shape participating in cardiac rehab. Today, he denies symptoms of palpitations, chest pain, shortness of breath, orthopnea, PND, lower extremity edema, dizziness, or syncope.  Past Medical History:  Diagnosis Date  . 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 Coronary artery disease    a. cath 07/19/17 critical in-stent restenosis/thrombosis in a large, dominant RCA s/p successfully PCI using a 4.0 x 23 mm Sierra DES.. Medical managment for mild non obstructive CAD  . Hypertension     Past Surgical History:  Procedure Laterality Date  . CARDIAC SURGERY  1996   stents  . CORONARY/GRAFT ACUTE MI REVASCULARIZATION N/A 07/19/2017   Procedure: Coronary/Graft Acute MI Revascularization;  Surgeon: Tonny Bollman, MD;  Location: [facility] INVASIVE CV LAB;  Service: Cardiovascular;  Laterality: N/A;  . LEFT HEART CATH AND CORONARY ANGIOGRAPHY N/A 07/19/2017   Procedure: LEFT HEART CATH AND CORONARY ANGIOGRAPHY;   Surgeon: Tonny Bollman, MD;  Location: [facility] INVASIVE CV LAB;  Service: Cardiovascular;  Laterality: N/A;    Current Outpatient Medications  Medication Sig Dispense Refill  . aspirin EC 81 MG tablet Take 81 mg by mouth daily.    Marland Kitchen atorvastatin (LIPITOR) 80 MG tablet Take 1 tablet (80 mg total) by mouth daily at 6 PM. 30 tablet 6  . hydrOXYzine (VISTARIL) 25 MG capsule Take 25 mg by mouth daily as needed for anxiety.    Marland Kitchen lisinopril (PRINIVIL,ZESTRIL) 10 MG tablet Take 1 tablet (10 mg total) by mouth daily. 30 tablet 6  . metoprolol tartrate (LOPRESSOR) 50 MG tablet Take 50 mg by mouth 2 (two) times daily.    . nitroGLYCERIN (NITROSTAT) 0.4 MG SL tablet Place 1 tablet (0.4 mg total) under the tongue every 5 (five) minutes x 3 doses as needed for chest pain. 25 tablet 12  . rOPINIRole (REQUIP) 0.5 MG tablet Take 0.5 mg by mouth at bedtime.    . tadalafil (CIALIS) 10 MG tablet Take 10 mg by mouth daily as needed for erectile dysfunction.    . ticagrelor (BRILINTA) 90 MG TABS tablet Take 1 tablet (90 mg total) by mouth 2 (two) times daily. For 30 days free supply 60 tablet 0  . traZODone (DESYREL) 50 MG tablet Take 50 mg by mouth at bedtime as needed for sleep.     No current facility-administered medications for this visit.     Allergies:   Patient has no  known allergies.   Social History:  The patient  reports that  has never smoked. he has never used smokeless tobacco. He reports that he does not drink alcohol or use drugs.   Family History:  The patient's family history is not on file.    ROS:  Please see the history of present illness.  All other systems are reviewed and negative.   PHYSICAL EXAM: VS:  BP (!) 144/70   Pulse 66   Ht 6' (1.829 m)   Wt 207 lb 1.9 oz (93.9 kg)   BMI 28.09 kg/m  , BMI Body mass index is 28.09 kg/m. GEN: Well nourished, well developed, in no acute distress  HEENT: normal  Neck: no JVD, no masses. No carotid bruits Cardiac: RRR without murmur or  gallop     Respiratory:  clear to auscultation bilaterally, normal work of breathing GI: soft, nontender, nondistended, + BS MS: no deformity or atrophy  Ext: no pretibial edema, pedal pulses 2+= bilaterally Skin: warm and dry, no rash Neuro:  Strength and sensation are intact Psych: euthymic mood, full affect  EKG:  EKG is not ordered today.  Recent Labs: 07/19/2017: ALT 27 07/20/2017: BUN 9; Creatinine, Ser 0.71; Hemoglobin 12.7; Platelets 187; Potassium 3.6; Sodium 138   Lipid Panel     Component Value Date/Time   CHOL 143 07/20/2017 0203   TRIG 107 07/20/2017 0203   HDL 45 07/20/2017 0203   CHOLHDL 3.2 07/20/2017 0203   VLDL 21 07/20/2017 0203   LDLCALC 77 07/20/2017 0203      Wt Readings from Last 3 Encounters:  11/04/17 207 lb 1.9 oz (93.9 kg)  10/04/17 209 lb 3.5 oz (94.9 kg)  08/03/17 210 lb (95.3 kg)     Cardiac Studies Reviewed: Echo 07-20-2017: Study Conclusions  - Left ventricle: The cavity size was normal. Wall thickness was   increased in a pattern of moderate LVH. Systolic function was   normal. The estimated ejection fraction was in the range of 60%   to 65%. Wall motion was normal; there were no regional wall   motion abnormalities. Left ventricular diastolic function   parameters were normal. - Left atrium: The atrium was normal in size. - Inferior vena cava: The vessel was normal in size. The   respirophasic diameter changes were in the normal range (>= 50%),   consistent with normal central venous pressure.  Impressions:  - LVEF 60-65%, normal wall motion, mild to moderate LVH, normal   diastolic function, normal LA size, normal IVC.  ASSESSMENT AND PLAN: 1.  Coronary artery disease, native vessel, with history of MI but no current angina: The patient will continue on his current medical program which includes dual antiplatelet therapy with aspirin and ticagrelor, a high intensity statin drug with atorvastatin, beta-blocker, and an ACE  inhibitor.  He should remain on dual antiplatelet therapy for at least 12 months from the time of his acute MI in October 2018.  2.  Hyperlipidemia: lipids followed through [facility] system. He asked them to be faxed but I don't have copies yet. He's on a high-intensity statin.   3.  Hypertension: BP well-controlled. Taking lisinopril and metoprolol.   Current medicines are reviewed with the patient today.  The patient does not have concerns regarding medicines.  Labs/ tests ordered today include:  No orders of the defined types were placed in this encounter.  Disposition:   FU 06/2018 when out to one year from his MI. He continues to follow closely at  the [facility] in [place], [place].   Signed, Tonny Bollman, MD  11/04/2017 8:48 AM    [facility] [address] [place], [place], [place]  [address] Phone: [phone]; Fax: [phone]

## 2017-11-07 ENCOUNTER — Encounter (HOSPITAL_COMMUNITY)
Admission: RE | Admit: 2017-11-07 | Discharge: 2017-11-07 | Disposition: A | Discharge status: Home / Self Care | Payer: Non-veteran care | Source: Ambulatory Visit | Attending: Cardiovascular Disease | Admitting: Cardiovascular Disease

## 2017-11-07 DIAGNOSIS — I2111 ST elevation (STEMI) myocardial infarction involving right coronary artery: Secondary | ICD-10-CM | POA: Diagnosis not present

## 2017-11-07 DIAGNOSIS — Z955 Presence of coronary angioplasty implant and graft: Secondary | ICD-10-CM

## 2017-11-09 ENCOUNTER — Encounter (HOSPITAL_COMMUNITY)
Admission: RE | Admit: 2017-11-09 | Discharge: 2017-11-09 | Disposition: A | Discharge status: Home / Self Care | Payer: Non-veteran care | Source: Ambulatory Visit | Attending: Cardiovascular Disease | Admitting: Cardiovascular Disease

## 2017-11-09 DIAGNOSIS — Z955 Presence of coronary angioplasty implant and graft: Secondary | ICD-10-CM

## 2017-11-09 DIAGNOSIS — I2111 ST elevation (STEMI) myocardial infarction involving right coronary artery: Secondary | ICD-10-CM | POA: Diagnosis not present

## 2017-11-10 ENCOUNTER — Encounter (HOSPITAL_COMMUNITY): Payer: Self-pay

## 2017-11-10 NOTE — Progress Notes (Signed)
Cardiac Individual Treatment Plan  Patient Details  Name: Jason Cabrera MRN: [id number] Date of Birth: 1945-11-05 Referring Provider:     CARDIAC REHAB PHASE II ORIENTATION from 10/04/2017 in [facility] CARDIAC [facility]  Referring Provider  Tonny Bollman MD      Initial Encounter Date:    CARDIAC REHAB PHASE II ORIENTATION from 10/04/2017 in [facility] CARDIAC REHAB  Date  10/04/17  Referring Provider  Tonny Bollman MD      Visit Diagnosis: ST elevation myocardial infarction involving right coronary artery [facility])  Status post coronary artery stent placement  Patient's Home Medications on Admission:  Current Outpatient Medications:  .  aspirin EC 81 MG tablet, Take 81 mg by mouth daily., Disp: , Rfl:  .  atorvastatin (LIPITOR) 80 MG tablet, Take 1 tablet (80 mg total) by mouth daily at 6 PM., Disp: 30 tablet, Rfl: 6 .  hydrOXYzine (VISTARIL) 25 MG capsule, Take 25 mg by mouth daily as needed for anxiety., Disp: , Rfl:  .  lisinopril (PRINIVIL,ZESTRIL) 10 MG tablet, Take 1 tablet (10 mg total) by mouth daily., Disp: 30 tablet, Rfl: 6 .  metoprolol tartrate (LOPRESSOR) 50 MG tablet, Take 50 mg by mouth 2 (two) times daily., Disp: , Rfl:  .  nitroGLYCERIN (NITROSTAT) 0.4 MG SL tablet, Place 1 tablet (0.4 mg total) under the tongue every 5 (five) minutes x 3 doses as needed for chest pain., Disp: 25 tablet, Rfl: 12 .  rOPINIRole (REQUIP) 0.5 MG tablet, Take 0.5 mg by mouth at bedtime., Disp: , Rfl:  .  tadalafil (CIALIS) 10 MG tablet, Take 10 mg by mouth daily as needed for erectile dysfunction., Disp: , Rfl:  .  ticagrelor (BRILINTA) 90 MG TABS tablet, Take 1 tablet (90 mg total) by mouth 2 (two) times daily. For 30 days free supply, Disp: 60 tablet, Rfl: 0 .  traZODone (DESYREL) 50 MG tablet, Take 50 mg by mouth at bedtime as needed for sleep., Disp: , Rfl:   Past Medical History: Past Medical History:  Diagnosis Date  . Acute inferoposterior  myocardial infarction (HCC) 07/19/2017  . Coronary artery disease    a. cath 07/19/17 critical in-stent restenosis/thrombosis in a large, dominant RCA s/p successfully PCI using a 4.0 x 23 mm Sierra DES.. Medical managment for mild non obstructive CAD  . Hypertension     Tobacco Use: Social History   Tobacco Use  Smoking Status Never Smoker  Smokeless Tobacco Never Used    Labs: Recent Review Advice worker    Labs for ITP Cardiac and Pulmonary Rehab Latest Ref Rng & Units 07/19/2017 07/20/2017   Cholestrol 0 - 200 mg/dL 119 147   LDLCALC 0 - 99 mg/dL 79 77   HDL >82 mg/dL 95(A) 45   Trlycerides <150 mg/dL 213 086   Hemoglobin V7Q 4.8 - 5.6 % 5.3 -   TCO2 22 - 32 mmol/L 24 -      Capillary Blood Glucose: No results found for: GLUCAP   Exercise Target Goals:    Exercise Program Goal: Individual exercise prescription set using results from initial 6 min walk test and THRR while considering  patient's activity barriers and safety.   Exercise Prescription Goal: Initial exercise prescription builds to 30-45 minutes a day of aerobic activity, 2-3 days per week.  Home exercise guidelines will be given to patient during program as part of exercise prescription that the participant will acknowledge.  Activity Barriers & Risk Stratification: Activity Barriers & Cardiac  Risk Stratification - 10/04/17 1155      Activity Barriers & Cardiac Risk Stratification   Activity Barriers  Muscular Weakness;Deconditioning    Cardiac Risk Stratification  High       6 Minute Walk: 6 Minute Walk    Row Name 10/04/17 1154         6 Minute Walk   Phase  Initial     Distance  1870 feet     Walk Time  6 minutes     # of Rest Breaks  0     MPH  3.54     METS  3.87     RPE  11     VO2 Peak  13.57     Symptoms  No     Resting HR  69 bpm     Resting BP  132/70     Resting Oxygen Saturation   99 %     Exercise Oxygen Saturation  during 6 min walk  100 %     Max Ex. HR  87 bpm      Max Ex. BP  144/80     2 Minute Post BP  108/60        Oxygen Initial Assessment:   Oxygen Re-Evaluation:   Oxygen Discharge (Final Oxygen Re-Evaluation):   Initial Exercise Prescription: Initial Exercise Prescription - 10/04/17 1100      Date of Initial Exercise RX and Referring Provider   Date  10/04/17    Referring Provider  Tonny Bollman MD      Treadmill   MPH  3    Grade  0    Minutes  15    METs  3.3      Bike   Level  1    Minutes  15    METs  2.99      NuStep   Level  3    SPM  80    Minutes  15    METs  2.2      Prescription Details   Frequency (times per week)  3    Duration  Progress to 30 minutes of continuous aerobic without signs/symptoms of physical distress      Intensity   THRR 40-80% of Max Heartrate  60-121    Ratings of Perceived Exertion  11-15    Perceived Dyspnea  0-4      Progression   Progression  Continue to progress workloads to maintain intensity without signs/symptoms of physical distress.      Resistance Training   Training Prescription  Yes    Weight  3lbs    Reps  10-15       Perform Capillary Blood Glucose checks as needed.  Exercise Prescription Changes: Exercise Prescription Changes    Row Name 10/10/17 1247 10/24/17 1717 11/07/17 1458         Response to Exercise   Blood Pressure (Admit)  140/74  124/60  138/76     Blood Pressure (Exercise)  164/70  128/70  142/78     Blood Pressure (Exit)  120/70  136/74  128/78     Heart Rate (Admit)  66 bpm  71 bpm  63 bpm     Heart Rate (Exercise)  103 bpm  100 bpm  93 bpm     Heart Rate (Exit)  74 bpm  69 bpm  67 bpm     Rating of Perceived Exertion (Exercise)  13  12  12      Symptoms  none  none  none     Comments  pt oriented to exercise equipment today  -  -     Duration  Continue with 30 min of aerobic exercise without signs/symptoms of physical distress.  Continue with 30 min of aerobic exercise without signs/symptoms of physical distress.  Continue with 30 min  of aerobic exercise without signs/symptoms of physical distress.     Intensity  THRR unchanged  THRR unchanged  THRR unchanged       Progression   Progression  Continue to progress workloads to maintain intensity without signs/symptoms of physical distress.  Continue to progress workloads to maintain intensity without signs/symptoms of physical distress.  Continue to progress workloads to maintain intensity without signs/symptoms of physical distress.     Average METs  3.1  3.6  4.2       Resistance Training   Training Prescription  Yes  Yes  Yes     Weight  3lbs  3lbs  3lbs     Reps  10-15  10-15  10-15     Time  10 Minutes  10 Minutes  10 Minutes       Interval Training   Interval Training  -  No  No       Treadmill   MPH  3  -  3.3     Grade  0  -  3     Minutes  15  -  15     METs  3.3  -  4.89       Bike   Level  1  1.6  -     Minutes  15  15  -     METs  2.99  4.19  -       NuStep   Level  -  4  5     SPM  -  90  90     Minutes  -  15  15     METs  -  3.1  3.6       Home Exercise Plan   Plans to continue exercise at  -  Home (comment)  Home (comment)     Frequency  -  Add 2 additional days to program exercise sessions.  Add 2 additional days to program exercise sessions.     Initial Home Exercises Provided  -  10/17/17  10/17/17        Exercise Comments: Exercise Comments    Row Name 10/10/17 1248 11/08/17 1507         Exercise Comments  Pt was oriented to exercise equipment on 10/10/17. Pt was able to exercise safelty for 30 minutes without any sign/symptoms of physical distress. Willl progress pt's activity level as tolerated.  Reviewed METs and goals. Pt is tolerating exercise program very well; will continue to monitor activity levels and progress as tolerated.         Exercise Goals and Review: Exercise Goals    Row Name 10/04/17 0910             Exercise Goals   Increase Physical Activity  Yes       Intervention  Develop an individualized  exercise prescription for aerobic and resistive training based on initial evaluation findings, risk stratification, comorbidities and participant's personal goals.;Provide advice, education, support and counseling about physical activity/exercise needs.       Expected Outcomes  Achievement of increased cardiorespiratory fitness and enhanced flexibility, muscular endurance and strength shown through  measurements of functional capacity and personal statement of participant.       Increase Strength and Stamina  Yes       Intervention  Provide advice, education, support and counseling about physical activity/exercise needs.;Develop an individualized exercise prescription for aerobic and resistive training based on initial evaluation findings, risk stratification, comorbidities and participant's personal goals.       Expected Outcomes  Achievement of increased cardiorespiratory fitness and enhanced flexibility, muscular endurance and strength shown through measurements of functional capacity and personal statement of participant.       Able to understand and use rate of perceived exertion (RPE) scale  Yes       Intervention  Provide education and explanation on how to use RPE scale       Expected Outcomes  Short Term: Able to use RPE daily in rehab to express subjective intensity level;Long Term:  Able to use RPE to guide intensity level when exercising independently       Knowledge and understanding of Target Heart Rate Range (THRR)  Yes       Intervention  Provide education and explanation of THRR including how the numbers were predicted and where they are located for reference       Expected Outcomes  Short Term: Able to state/look up THRR;Long Term: Able to use THRR to govern intensity when exercising independently;Short Term: Able to use daily as guideline for intensity in rehab       Able to check pulse independently  Yes       Intervention  Provide education and demonstration on how to check pulse in  carotid and radial arteries.;Review the importance of being able to check your own pulse for safety during independent exercise       Expected Outcomes  Short Term: Able to explain why pulse checking is important during independent exercise;Long Term: Able to check pulse independently and accurately       Understanding of Exercise Prescription  Yes       Intervention  Provide education, explanation, and written materials on patient's individual exercise prescription       Expected Outcomes  Short Term: Able to explain program exercise prescription;Long Term: Able to explain home exercise prescription to exercise independently          Exercise Goals Re-Evaluation : Exercise Goals Re-Evaluation    Row Name 10/12/17 1250 10/17/17 1523 11/08/17 1511         Exercise Goal Re-Evaluation   Exercise Goals Review  Increase Physical Activity;Understanding of Exercise Prescription;Able to understand and use rate of perceived exertion (RPE) scale  Increase Physical Activity;Able to understand and use rate of perceived exertion (RPE) scale;Knowledge and understanding of Target Heart Rate Range (THRR);Understanding of Exercise Prescription;Increase Strength and Stamina;Able to check pulse independently  Increase Physical Activity;Able to understand and use rate of perceived exertion (RPE) scale;Knowledge and understanding of Target Heart Rate Range (THRR);Understanding of Exercise Prescription;Increase Strength and Stamina;Able to check pulse independently     Comments  Pt demonstrated proper use of RPE scale and has great understanding of exercise flow and order. Pt will continue to exercise for 30 minutes symptom free.  Reviewed home exercise with pt today.  Pt plans to walk for exercise, 2-3x/week in addition to coming to cardiac rehab.  Reviewed THR, pulse, RPE, sign and symptoms, NTG use, and when to call 911 or MD.  Also discussed weather considerations and indoor options.  Pt voiced understanding.  Pt  handles WL increases very  well; pt has increased walking tolerance on treadmill and WL on Nustep machine. Pt is walking at home for exercise program.     Expected Outcomes  Pt will continue to improve in cardiorespiratory fitness, functional capacity and overall well being  Pt will continue to improve in cardiorespiratory fitness, functional capacity by being compliant with HEP.  Pt will continue to improve in cardiorespiratory fitness, functional capacity by being compliant with HEP.         Discharge Exercise Prescription (Final Exercise Prescription Changes): Exercise Prescription Changes - 11/07/17 1458      Response to Exercise   Blood Pressure (Admit)  138/76    Blood Pressure (Exercise)  142/78    Blood Pressure (Exit)  128/78    Heart Rate (Admit)  63 bpm    Heart Rate (Exercise)  93 bpm    Heart Rate (Exit)  67 bpm    Rating of Perceived Exertion (Exercise)  12    Symptoms  none    Duration  Continue with 30 min of aerobic exercise without signs/symptoms of physical distress.    Intensity  THRR unchanged      Progression   Progression  Continue to progress workloads to maintain intensity without signs/symptoms of physical distress.    Average METs  4.2      Resistance Training   Training Prescription  Yes    Weight  3lbs    Reps  10-15    Time  10 Minutes      Interval Training   Interval Training  No      Treadmill   MPH  3.3    Grade  3    Minutes  15    METs  4.89      NuStep   Level  5    SPM  90    Minutes  15    METs  3.6      Home Exercise Plan   Plans to continue exercise at  Home (comment)    Frequency  Add 2 additional days to program exercise sessions.    Initial Home Exercises Provided  10/17/17       Nutrition:  Target Goals: Understanding of nutrition guidelines, daily intake of sodium 1500mg , cholesterol 200mg , calories 30% from fat and 7% or less from saturated fats, daily to have 5 or more servings of fruits and  vegetables.  Biometrics: Pre Biometrics - 10/04/17 1206      Pre Biometrics   Height  5' 11.25" (1.81 m)    Weight  209 lb 3.5 oz (94.9 kg)    Waist Circumference  41 inches    Hip Circumference  43.25 inches    Waist to Hip Ratio  0.95 %    BMI (Calculated)  28.97    Triceps Skinfold  15 mm    % Body Fat  28.2 %    Grip Strength  44 kg    Flexibility  12 in    Single Leg Stand  27.69 seconds        Nutrition Therapy Plan and Nutrition Goals: Nutrition Therapy & Goals - 10/05/17 1147      Nutrition Therapy   Diet  Heart Healthy      Personal Nutrition Goals   Nutrition Goal  Pt to identify food quantities necessary to achieve weight loss of 6-15 lb at graduation from cardiac rehab. Goal wt of 195 lb desired.       Intervention Plan   Intervention  Prescribe, educate  and counsel regarding individualized specific dietary modifications aiming towards targeted core components such as weight, hypertension, lipid management, diabetes, heart failure and other comorbidities.    Expected Outcomes  Short Term Goal: Understand basic principles of dietary content, such as calories, fat, sodium, cholesterol and nutrients.;Long Term Goal: Adherence to prescribed nutrition plan.       Nutrition Assessments: Nutrition Assessments - 10/05/17 1147      MEDFICTS Scores   Pre Score  29       Nutrition Goals Re-Evaluation:   Nutrition Goals Re-Evaluation:   Nutrition Goals Discharge (Final Nutrition Goals Re-Evaluation):   Psychosocial: Target Goals: Acknowledge presence or absence of significant depression and/or stress, maximize coping skills, provide positive support system. Participant is able to verbalize types and ability to use techniques and skills needed for reducing stress and depression.  Initial Review & Psychosocial Screening: Initial Psych Review & Screening - 10/04/17 1027      Initial Review   Current issues with  None Identified      Family Dynamics   Good  Support System?  Yes  (Significant)  Friend     Comments  no psychosocial needs identified, no interventions necessary       Barriers   Psychosocial barriers to participate in program  There are no identifiable barriers or psychosocial needs.      Screening Interventions   Interventions  Encouraged to exercise       Quality of Life Scores: Quality of Life - 11/07/17 1621      Quality of Life Scores   Health/Function Pre  25.73 %    Socioeconomic Pre  28.44 %    Psych/Spiritual Pre  24.21 %    Family Pre  27.1 %    GLOBAL Pre  26.24 % overall good scores. pt exhibits positve outlook with good coping skills.       Scores of 19 and below usually indicate a poorer quality of life in these areas.  A difference of  2-3 points is a clinically meaningful difference.  A difference of 2-3 points in the total score of the Quality of Life Index has been associated with significant improvement in overall quality of life, self-image, physical symptoms, and general health in studies assessing change in quality of life.  PHQ-9: Recent Review Flowsheet Data    Depression screen [facility] 2/9 10/10/2017   Decreased Interest 0   Down, Depressed, Hopeless 1   PHQ - 2 Score 1     Interpretation of Total Score  Total Score Depression Severity:  1-4 = Minimal depression, 5-9 = Mild depression, 10-14 = Moderate depression, 15-19 = Moderately severe depression, 20-27 = Severe depression   Psychosocial Evaluation and Intervention: Psychosocial Evaluation - 10/10/17 1442      Psychosocial Evaluation & Interventions   Interventions  Encouraged to exercise with the program and follow exercise prescription;Stress management education;Relaxation education    Comments  pt with known history of depression, anxiety and PTSD ([place] Vet)  verbalizes improvement in his symptoms over past year. pt reports he has been off zoloft for 1 year.  pt enjoys spending time with his girlfirend going to flea markets, yard [employer]  and walking the neighborhood.      Expected Outcomes  pt will exhibit positive outlook with good coping skills.     Continue Psychosocial Services   Follow up required by staff       Psychosocial Re-Evaluation: Psychosocial Re-Evaluation    Row Name 11/10/17 1046  Psychosocial Re-Evaluation   Current issues with  None Identified       Comments  no psychosocial needs identified, no interventions necessary        Expected Outcomes  pt will exhibit positive outlook with good coping skills.        Interventions  Encouraged to attend Cardiac Rehabilitation for the exercise;Stress management education;Relaxation education       Continue Psychosocial Services   No Follow up required          Psychosocial Discharge (Final Psychosocial Re-Evaluation): Psychosocial Re-Evaluation - 11/10/17 1046      Psychosocial Re-Evaluation   Current issues with  None Identified    Comments  no psychosocial needs identified, no interventions necessary     Expected Outcomes  pt will exhibit positive outlook with good coping skills.     Interventions  Encouraged to attend Cardiac Rehabilitation for the exercise;Stress management education;Relaxation education    Continue Psychosocial Services   No Follow up required       Vocational Rehabilitation: Provide vocational rehab assistance to qualifying candidates.   Vocational Rehab Evaluation & Intervention: Vocational Rehab - 10/04/17 1027      Initial Vocational Rehab Evaluation & Intervention   Assessment shows need for Vocational Rehabilitation  No       Education: Education Goals: Education classes will be provided on a weekly basis, covering required topics. Participant will state understanding/return demonstration of topics presented.  Learning Barriers/Preferences: Learning Barriers/Preferences - 10/04/17 0911      Learning Barriers/Preferences   Learning Barriers  Sight    Learning Preferences  Verbal  Instruction;Video;Written Material;Pictoral;Skilled Demonstration       Education Topics: Count Your Pulse:  -Group instruction provided by verbal instruction, demonstration, patient participation and written materials to support subject.  Instructors address importance of being able to find your pulse and how to count your pulse when at home without a heart monitor.  Patients get hands on experience counting their pulse with staff help and individually.   Heart Attack, Angina, and Risk Factor Modification:  -Group instruction provided by verbal instruction, video, and written materials to support subject.  Instructors address signs and symptoms of angina and heart attacks.    Also discuss risk factors for heart disease and how to make changes to improve heart health risk factors.   Functional Fitness:  -Group instruction provided by verbal instruction, demonstration, patient participation, and written materials to support subject.  Instructors address safety measures for doing things around the house.  Discuss how to get up and down off the floor, how to pick things up properly, how to safely get out of a chair without assistance, and balance training.   Meditation and Mindfulness:  -Group instruction provided by verbal instruction, patient participation, and written materials to support subject.  Instructor addresses importance of mindfulness and meditation practice to help reduce stress and improve awareness.  Instructor also leads participants through a meditation exercise.    Stretching for Flexibility and Mobility:  -Group instruction provided by verbal instruction, patient participation, and written materials to support subject.  Instructors lead participants through series of stretches that are designed to increase flexibility thus improving mobility.  These stretches are additional exercise for major muscle groups that are typically performed during regular warm up and cool  down.   Hands Only CPR:  -Group verbal, video, and participation provides a basic overview of AHA guidelines for community CPR. Role-play of emergencies allow participants the opportunity to practice calling for help  and chest compression technique with discussion of AED use.   Hypertension: -Group verbal and written instruction that provides a basic overview of hypertension including the most recent diagnostic guidelines, risk factor reduction with self-care instructions and medication management.    Nutrition I class: Heart Healthy Eating:  -Group instruction provided by PowerPoint slides, verbal discussion, and written materials to support subject matter. The instructor gives an explanation and review of the Therapeutic Lifestyle Changes diet recommendations, which includes a discussion on lipid goals, dietary fat, sodium, fiber, plant stanol/sterol esters, sugar, and the components of a well-balanced, healthy diet.   Nutrition II class: Lifestyle Skills:  -Group instruction provided by PowerPoint slides, verbal discussion, and written materials to support subject matter. The instructor gives an explanation and review of label reading, grocery shopping for heart health, heart healthy recipe modifications, and ways to make healthier choices when eating out.   Diabetes Question & Answer:  -Group instruction provided by PowerPoint slides, verbal discussion, and written materials to support subject matter. The instructor gives an explanation and review of diabetes co-morbidities, pre- and post-prandial blood glucose goals, pre-exercise blood glucose goals, signs, symptoms, and treatment of hypoglycemia and hyperglycemia, and foot care basics.   Diabetes Blitz:  -Group instruction provided by PowerPoint slides, verbal discussion, and written materials to support subject matter. The instructor gives an explanation and review of the physiology behind type 1 and type 2 diabetes, diabetes  medications and rational behind using different medications, pre- and post-prandial blood glucose recommendations and Hemoglobin A1c goals, diabetes diet, and exercise including blood glucose guidelines for exercising safely.    Portion Distortion:  -Group instruction provided by PowerPoint slides, verbal discussion, written materials, and food models to support subject matter. The instructor gives an explanation of serving size versus portion size, changes in portions sizes over the last 20 years, and what consists of a serving from each food group.   Stress Management:  -Group instruction provided by verbal instruction, video, and written materials to support subject matter.  Instructors review role of stress in heart disease and how to cope with stress positively.     Exercising on Your Own:  -Group instruction provided by verbal instruction, power point, and written materials to support subject.  Instructors discuss benefits of exercise, components of exercise, frequency and intensity of exercise, and end points for exercise.  Also discuss use of nitroglycerin and activating EMS.  Review options of places to exercise outside of rehab.  Review guidelines for sex with heart disease.   Cardiac Drugs I:  -Group instruction provided by verbal instruction and written materials to support subject.  Instructor reviews cardiac drug classes: antiplatelets, anticoagulants, beta blockers, and statins.  Instructor discusses reasons, side effects, and lifestyle considerations for each drug class.   Cardiac Drugs II:  -Group instruction provided by verbal instruction and written materials to support subject.  Instructor reviews cardiac drug classes: angiotensin converting enzyme inhibitors (ACE-I), angiotensin II receptor blockers (ARBs), nitrates, and calcium channel blockers.  Instructor discusses reasons, side effects, and lifestyle considerations for each drug class.   Anatomy and Physiology of the  Circulatory System:  Group verbal and written instruction and models provide basic cardiac anatomy and physiology, with the coronary electrical and arterial systems. Review of: AMI, Angina, Valve disease, Heart Failure, Peripheral Artery Disease, Cardiac Arrhythmia, Pacemakers, and the ICD.   Other Education:  -Group or individual verbal, written, or video instructions that support the educational goals of the cardiac rehab program.   Holiday Eating  Survival Tips:  -Group instruction provided by PowerPoint slides, verbal discussion, and written materials to support subject matter. The instructor gives patients tips, tricks, and techniques to help them not only survive but enjoy the holidays despite the onslaught of food that accompanies the holidays.   Knowledge Questionnaire Score: Knowledge Questionnaire Score - 10/04/17 1026      Knowledge Questionnaire Score   Pre Score  19/24       Core Components/Risk Factors/Patient Goals at Admission: Personal Goals and Risk Factors at Admission - 10/04/17 1207      Core Components/Risk Factors/Patient Goals on Admission    Weight Management  Yes;Weight Maintenance;Weight Loss    Intervention  Weight Management: Develop a combined nutrition and exercise program designed to reach desired caloric intake, while maintaining appropriate intake of nutrient and fiber, sodium and fats, and appropriate energy expenditure required for the weight goal.;Weight Management: Provide education and appropriate resources to help participant work on and attain dietary goals.;Weight Management/Obesity: Establish reasonable short term and long term weight goals.    Admit Weight  209 lb 3.5 oz (94.9 kg)    Goal Weight: Short Term  199 lb (90.3 kg)    Goal Weight: Long Term  195 lb (88.5 kg)    Expected Outcomes  Short Term: Continue to assess and modify interventions until short term weight is achieved;Long Term: Adherence to nutrition and physical activity/exercise  program aimed toward attainment of established weight goal;Weight Maintenance: Understanding of the daily nutrition guidelines, which includes 25-35% calories from fat, 7% or less cal from saturated fats, less than 200mg  cholesterol, less than 1.5gm of sodium, & 5 or more servings of fruits and vegetables daily;Weight Loss: Understanding of general recommendations for a balanced deficit meal plan, which promotes 1-2 lb weight loss per week and includes a negative energy balance of [phone] kcal/d;Understanding of distribution of calorie intake throughout the day with the consumption of 4-5 meals/snacks;Understanding recommendations for meals to include 15-35% energy as protein, 25-35% energy from fat, 35-60% energy from carbohydrates, less than 200mg  of dietary cholesterol, 20-35 gm of total fiber daily    Hypertension  Yes    Intervention  Provide education on lifestyle modifcations including regular physical activity/exercise, weight management, moderate sodium restriction and increased consumption of fresh fruit, vegetables, and low fat dairy, alcohol moderation, and smoking cessation.;Monitor prescription use compliance.    Expected Outcomes  Short Term: Continued assessment and intervention until BP is < 140/37mm HG in hypertensive participants. < 130/51mm HG in hypertensive participants with diabetes, heart failure or chronic kidney disease.;Long Term: Maintenance of blood pressure at goal levels.    Lipids  Yes    Intervention  Provide education and support for participant on nutrition & aerobic/resistive exercise along with prescribed medications to achieve LDL 70mg , HDL >40mg .    Expected Outcomes  Short Term: Participant states understanding of desired cholesterol values and is compliant with medications prescribed. Participant is following exercise prescription and nutrition guidelines.;Long Term: Cholesterol controlled with medications as prescribed, with individualized exercise RX and with  personalized nutrition plan. Value goals: LDL < 70mg , HDL > 40 mg.       Core Components/Risk Factors/Patient Goals Review:  Goals and Risk Factor Review    Row Name 10/10/17 1440 11/10/17 1039           Core Components/Risk Factors/Patient Goals Review   Personal Goals Review  Weight Management/Obesity;Lipids;Hypertension  Weight Management/Obesity;Lipids;Hypertension      Review  pt with CAD RF demonstrates eagerness to participate  in CR program. pt personal goal is to lose 10 lbs and increase social interaction.   pt with CAD RF demonstrates eagerness to participate in CR program. pt displays compliance with HTN and lipid management.        Expected Outcomes  pt will participate in CR exercise, nutitrition and lifestyle modification to decrease overall RF.    pt will participate in CR exercise, nutitrition and lifestyle modification to decrease overall RF.           Core Components/Risk Factors/Patient Goals at Discharge (Final Review):  Goals and Risk Factor Review - 11/10/17 1039      Core Components/Risk Factors/Patient Goals Review   Personal Goals Review  Weight Management/Obesity;Lipids;Hypertension    Review  pt with CAD RF demonstrates eagerness to participate in CR program. pt displays compliance with HTN and lipid management.      Expected Outcomes  pt will participate in CR exercise, nutitrition and lifestyle modification to decrease overall RF.         ITP Comments: ITP Comments    Row Name 10/04/17 0909 10/10/17 1437 11/10/17 1036       ITP Comments  Dr. Armanda Magic, Medical Director  pt started group exercise sessions today. pt tolerated light activity without difficulty.   30 day ITP review. pt demonstrates willingness to participate in CR opportunitites with good attendance and participation.          Comments:

## 2017-11-11 ENCOUNTER — Encounter (HOSPITAL_COMMUNITY)
Admission: RE | Admit: 2017-11-11 | Discharge: 2017-11-11 | Disposition: A | Discharge status: Home / Self Care | Payer: Non-veteran care | Source: Ambulatory Visit | Attending: Cardiovascular Disease | Admitting: Cardiovascular Disease

## 2017-11-11 DIAGNOSIS — I2111 ST elevation (STEMI) myocardial infarction involving right coronary artery: Secondary | ICD-10-CM

## 2017-11-11 DIAGNOSIS — Z955 Presence of coronary angioplasty implant and graft: Secondary | ICD-10-CM

## 2017-11-14 ENCOUNTER — Encounter (HOSPITAL_COMMUNITY)
Admission: RE | Admit: 2017-11-14 | Discharge: 2017-11-14 | Disposition: A | Discharge status: Home / Self Care | Payer: Non-veteran care | Source: Ambulatory Visit | Attending: Cardiovascular Disease | Admitting: Cardiovascular Disease

## 2017-11-14 DIAGNOSIS — Z955 Presence of coronary angioplasty implant and graft: Secondary | ICD-10-CM

## 2017-11-14 DIAGNOSIS — I2111 ST elevation (STEMI) myocardial infarction involving right coronary artery: Secondary | ICD-10-CM

## 2017-11-16 ENCOUNTER — Encounter (HOSPITAL_COMMUNITY)
Admission: RE | Admit: 2017-11-16 | Discharge: 2017-11-16 | Disposition: A | Discharge status: Home / Self Care | Payer: Non-veteran care | Source: Ambulatory Visit | Attending: Cardiovascular Disease | Admitting: Cardiovascular Disease

## 2017-11-16 DIAGNOSIS — I2111 ST elevation (STEMI) myocardial infarction involving right coronary artery: Secondary | ICD-10-CM

## 2017-11-16 DIAGNOSIS — Z955 Presence of coronary angioplasty implant and graft: Secondary | ICD-10-CM

## 2017-11-18 ENCOUNTER — Encounter (HOSPITAL_COMMUNITY)
Admission: RE | Admit: 2017-11-18 | Discharge: 2017-11-18 | Disposition: A | Discharge status: Home / Self Care | Payer: Non-veteran care | Source: Ambulatory Visit | Attending: Cardiovascular Disease | Admitting: Cardiovascular Disease

## 2017-11-18 DIAGNOSIS — Z955 Presence of coronary angioplasty implant and graft: Secondary | ICD-10-CM

## 2017-11-18 DIAGNOSIS — I2111 ST elevation (STEMI) myocardial infarction involving right coronary artery: Secondary | ICD-10-CM | POA: Diagnosis not present

## 2017-11-18 DIAGNOSIS — Z79899 Other long term (current) drug therapy: Secondary | ICD-10-CM | POA: Diagnosis not present

## 2017-11-18 DIAGNOSIS — I2510 Atherosclerotic heart disease of native coronary artery without angina pectoris: Secondary | ICD-10-CM | POA: Insufficient documentation

## 2017-11-18 DIAGNOSIS — I10 Essential (primary) hypertension: Secondary | ICD-10-CM | POA: Diagnosis not present

## 2017-11-18 DIAGNOSIS — Z7982 Long term (current) use of aspirin: Secondary | ICD-10-CM | POA: Insufficient documentation

## 2017-11-18 DIAGNOSIS — I213 ST elevation (STEMI) myocardial infarction of unspecified site: Secondary | ICD-10-CM | POA: Diagnosis present

## 2017-11-21 ENCOUNTER — Encounter (HOSPITAL_COMMUNITY)
Admission: RE | Admit: 2017-11-21 | Discharge: 2017-11-21 | Disposition: A | Discharge status: Home / Self Care | Payer: Non-veteran care | Source: Ambulatory Visit | Attending: Cardiovascular Disease | Admitting: Cardiovascular Disease

## 2017-11-21 DIAGNOSIS — I2111 ST elevation (STEMI) myocardial infarction involving right coronary artery: Secondary | ICD-10-CM | POA: Diagnosis not present

## 2017-11-21 DIAGNOSIS — Z955 Presence of coronary angioplasty implant and graft: Secondary | ICD-10-CM

## 2017-11-23 ENCOUNTER — Encounter (HOSPITAL_COMMUNITY)
Admission: RE | Admit: 2017-11-23 | Discharge: 2017-11-23 | Disposition: A | Discharge status: Home / Self Care | Payer: Non-veteran care | Source: Ambulatory Visit | Attending: Cardiovascular Disease | Admitting: Cardiovascular Disease

## 2017-11-23 DIAGNOSIS — Z955 Presence of coronary angioplasty implant and graft: Secondary | ICD-10-CM

## 2017-11-23 DIAGNOSIS — I2111 ST elevation (STEMI) myocardial infarction involving right coronary artery: Secondary | ICD-10-CM | POA: Diagnosis not present

## 2017-11-25 ENCOUNTER — Encounter (HOSPITAL_COMMUNITY)
Admission: RE | Admit: 2017-11-25 | Discharge: 2017-11-25 | Disposition: A | Discharge status: Home / Self Care | Payer: Non-veteran care | Source: Ambulatory Visit | Attending: Cardiovascular Disease | Admitting: Cardiovascular Disease

## 2017-11-25 DIAGNOSIS — I2111 ST elevation (STEMI) myocardial infarction involving right coronary artery: Secondary | ICD-10-CM

## 2017-11-25 DIAGNOSIS — Z955 Presence of coronary angioplasty implant and graft: Secondary | ICD-10-CM

## 2017-11-28 ENCOUNTER — Encounter (HOSPITAL_COMMUNITY)
Admission: RE | Admit: 2017-11-28 | Discharge: 2017-11-28 | Disposition: A | Discharge status: Home / Self Care | Payer: Non-veteran care | Source: Ambulatory Visit | Attending: Cardiovascular Disease | Admitting: Cardiovascular Disease

## 2017-11-28 DIAGNOSIS — I2111 ST elevation (STEMI) myocardial infarction involving right coronary artery: Secondary | ICD-10-CM

## 2017-11-28 DIAGNOSIS — Z955 Presence of coronary angioplasty implant and graft: Secondary | ICD-10-CM

## 2017-11-29 ENCOUNTER — Encounter (HOSPITAL_COMMUNITY): Payer: Self-pay

## 2017-11-30 ENCOUNTER — Encounter (HOSPITAL_COMMUNITY)
Admission: RE | Admit: 2017-11-30 | Discharge: 2017-11-30 | Disposition: A | Discharge status: Home / Self Care | Payer: Non-veteran care | Source: Ambulatory Visit | Attending: Cardiovascular Disease | Admitting: Cardiovascular Disease

## 2017-11-30 DIAGNOSIS — I2111 ST elevation (STEMI) myocardial infarction involving right coronary artery: Secondary | ICD-10-CM | POA: Diagnosis not present

## 2017-11-30 DIAGNOSIS — Z955 Presence of coronary angioplasty implant and graft: Secondary | ICD-10-CM

## 2017-12-02 ENCOUNTER — Encounter (HOSPITAL_COMMUNITY)
Admission: RE | Admit: 2017-12-02 | Discharge: 2017-12-02 | Disposition: A | Discharge status: Home / Self Care | Payer: Non-veteran care | Source: Ambulatory Visit | Attending: Cardiovascular Disease | Admitting: Cardiovascular Disease

## 2017-12-02 DIAGNOSIS — I2111 ST elevation (STEMI) myocardial infarction involving right coronary artery: Secondary | ICD-10-CM | POA: Diagnosis not present

## 2017-12-02 DIAGNOSIS — Z955 Presence of coronary angioplasty implant and graft: Secondary | ICD-10-CM

## 2017-12-05 ENCOUNTER — Encounter (HOSPITAL_COMMUNITY)
Admission: RE | Admit: 2017-12-05 | Discharge: 2017-12-05 | Disposition: A | Discharge status: Home / Self Care | Payer: Non-veteran care | Source: Ambulatory Visit | Attending: Cardiovascular Disease | Admitting: Cardiovascular Disease

## 2017-12-05 DIAGNOSIS — I2111 ST elevation (STEMI) myocardial infarction involving right coronary artery: Secondary | ICD-10-CM

## 2017-12-05 DIAGNOSIS — Z955 Presence of coronary angioplasty implant and graft: Secondary | ICD-10-CM

## 2017-12-07 ENCOUNTER — Encounter (HOSPITAL_COMMUNITY)
Admission: RE | Admit: 2017-12-07 | Discharge: 2017-12-07 | Disposition: A | Discharge status: Home / Self Care | Payer: Non-veteran care | Source: Ambulatory Visit | Attending: Cardiovascular Disease | Admitting: Cardiovascular Disease

## 2017-12-07 DIAGNOSIS — I2111 ST elevation (STEMI) myocardial infarction involving right coronary artery: Secondary | ICD-10-CM | POA: Diagnosis not present

## 2017-12-07 DIAGNOSIS — Z955 Presence of coronary angioplasty implant and graft: Secondary | ICD-10-CM

## 2017-12-08 NOTE — Progress Notes (Signed)
Cardiac Individual Treatment Plan  Patient Details  Name: Jason Cabrera MRN: [id number] Date of Birth: [birth year] Referring Provider:   Flowsheet Row CARDIAC REHAB PHASE II ORIENTATION from 10/04/2017 in [facility] CARDIAC REH[facility]  Referring Provider  Tonny Bollman MD      Initial Encounter Date:  Flowsheet Row CARDIAC REHAB PHASE II ORIENTATION from 10/04/2017 in [facility] CARDIAC REHAB  Date  10/04/17  Referring Provider  Tonny Bollman MD      Visit Diagnosis: ST elevation myocardial infarction involving right coronary artery [facility])  Status post coronary artery stent placement  Patient's Home Medications on Admission:  Current Outpatient Medications:  .  aspirin EC 81 MG tablet, Take 81 mg by mouth daily., Disp: , Rfl:  .  atorvastatin (LIPITOR) 80 MG tablet, Take 1 tablet (80 mg total) by mouth daily at 6 PM., Disp: 30 tablet, Rfl: 6 .  hydrOXYzine (VISTARIL) 25 MG capsule, Take 25 mg by mouth daily as needed for anxiety., Disp: , Rfl:  .  lisinopril (PRINIVIL,ZESTRIL) 10 MG tablet, Take 1 tablet (10 mg total) by mouth daily., Disp: 30 tablet, Rfl: 6 .  metoprolol tartrate (LOPRESSOR) 50 MG tablet, Take 50 mg by mouth 2 (two) times daily., Disp: , Rfl:  .  nitroGLYCERIN (NITROSTAT) 0.4 MG SL tablet, Place 1 tablet (0.4 mg total) under the tongue every 5 (five) minutes x 3 doses as needed for chest pain., Disp: 25 tablet, Rfl: 12 .  rOPINIRole (REQUIP) 0.5 MG tablet, Take 0.5 mg by mouth at bedtime., Disp: , Rfl:  .  tadalafil (CIALIS) 10 MG tablet, Take 10 mg by mouth daily as needed for erectile dysfunction., Disp: , Rfl:  .  ticagrelor (BRILINTA) 90 MG TABS tablet, Take 1 tablet (90 mg total) by mouth 2 (two) times daily. For 30 days free supply, Disp: 60 tablet, Rfl: 0 .  traZODone (DESYREL) 50 MG tablet, Take 50 mg by mouth at bedtime as needed for sleep., Disp: , Rfl:   Past Medical History: Past Medical History:  Diagnosis Date  .  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 Coronary artery disease    a. cath 07/19/17 critical in-stent restenosis/thrombosis in a large, dominant RCA s/p successfully PCI using a 4.0 x 23 mm Sierra DES.. Medical managment for mild non obstructive CAD  . Hypertension     Tobacco Use: Social History   Tobacco Use  Smoking Status Never Smoker  Smokeless Tobacco Never Used    Labs: Recent Review Advice worker    Labs for ITP Cardiac and Pulmonary Rehab Latest Ref Rng & Units 07/19/2017 07/20/2017   Cholestrol 0 - 200 mg/dL 628 366   LDLCALC 0 - 99 mg/dL 79 77   HDL >29 mg/dL 47(M) 45   Trlycerides <150 mg/dL 546 503   Hemoglobin T4S 4.8 - 5.6 % 5.3 -   TCO2 22 - 32 mmol/L 24 -      Capillary Blood Glucose: No results found for: GLUCAP   Exercise Target Goals:    Exercise Program Goal: Individual exercise prescription set using results from initial 6 min walk test and THRR while considering  patient's activity barriers and safety.   Exercise Prescription Goal: Initial exercise prescription builds to 30-45 minutes a day of aerobic activity, 2-3 days per week.  Home exercise guidelines will be given to patient during program as part of exercise prescription that the participant will acknowledge.  Activity Barriers & Risk Stratification: Activity Barriers & Cardiac  Risk Stratification - 10/04/17 1155    Activity Barriers & Cardiac Risk Stratification          Activity Barriers  Muscular Weakness;Deconditioning    Cardiac Risk Stratification  High           6 Minute Walk: 6 Minute Walk    6 Minute Walk    Row Name 10/04/17 1154   Phase  Initial   Distance  1870 feet   Walk Time  6 minutes   # of Rest Breaks  0   MPH  3.54   METS  3.87   RPE  11   VO2 Peak  13.57   Symptoms  No   Resting HR  69 bpm   Resting BP  132/70   Resting Oxygen Saturation   99 %   Exercise Oxygen Saturation  during 6 min walk  100 %   Max Ex. HR  87 bpm   Max  Ex. BP  144/80   2 Minute Post BP  108/60          Oxygen Initial Assessment:   Oxygen Re-Evaluation:   Oxygen Discharge (Final Oxygen Re-Evaluation):   Initial Exercise Prescription: Initial Exercise Prescription - 10/04/17 1100    Date of Initial Exercise RX and Referring Provider          Date  10/04/17    Referring Provider  Tonny Bollman MD        Treadmill          MPH  3    Grade  0    Minutes  15    METs  3.3        Bike          Level  1    Minutes  15    METs  2.99        NuStep          Level  3    SPM  80    Minutes  15    METs  2.2        Prescription Details          Frequency (times per week)  3    Duration  Progress to 30 minutes of continuous aerobic without signs/symptoms of physical distress        Intensity          THRR 40-80% of Max Heartrate  60-121    Ratings of Perceived Exertion  11-15    Perceived Dyspnea  0-4        Progression          Progression  Continue to progress workloads to maintain intensity without signs/symptoms of physical distress.        Resistance Training          Training Prescription  Yes    Weight  3lbs    Reps  10-15           Perform Capillary Blood Glucose checks as needed.  Exercise Prescription Changes: Exercise Prescription Changes    Response to Exercise    Row Name 10/10/17 1247 10/24/17 1717 11/07/17 1458 11/23/17 1604 12/05/17 1420   Blood Pressure (Admit)  140/74  124/60  138/76  108/62  122/62   Blood Pressure (Exercise)  164/70  128/70  142/78  132/80  144/72   Blood Pressure (Exit)  120/70  136/74  128/78  118/82  112/66   Heart Rate (Admit)  66 bpm  71 bpm  63 bpm  74 bpm  74 bpm   Heart Rate (Exercise)  103 bpm  100 bpm  93 bpm  97 bpm  105 bpm   Heart Rate (Exit)  74 bpm  69 bpm  67 bpm  74 bpm  84 bpm   Rating of Perceived Exertion (Exercise)  13  12  12  12  12    Symptoms  none  none  none  none  11   Comments  pt oriented to exercise equipment today  no  documentation  no documentation  no documentation  no documentation   Duration  Continue with 30 min of aerobic exercise without signs/symptoms of physical distress.  Continue with 30 min of aerobic exercise without signs/symptoms of physical distress.  Continue with 30 min of aerobic exercise without signs/symptoms of physical distress.  Continue with 30 min of aerobic exercise without signs/symptoms of physical distress.  Continue with 30 min of aerobic exercise without signs/symptoms of physical distress.   Intensity  THRR unchanged  THRR unchanged  THRR unchanged  THRR unchanged  THRR unchanged       Progression    Row Name 10/10/17 1247 10/24/17 1717 11/07/17 1458 11/23/17 1604 12/05/17 1420   Progression  Continue to progress workloads to maintain intensity without signs/symptoms of physical distress.  Continue to progress workloads to maintain intensity without signs/symptoms of physical distress.  Continue to progress workloads to maintain intensity without signs/symptoms of physical distress.  Continue to progress workloads to maintain intensity without signs/symptoms of physical distress.  Continue to progress workloads to maintain intensity without signs/symptoms of physical distress.   Average METs  3.1  3.6  4.2  4.6  4.2       Resistance Training    Row Name 10/10/17 1247 10/24/17 1717 11/07/17 1458 11/23/17 1604 12/05/17 1420   Training Prescription  Yes  Yes  Yes  No relaxation day  Yes   Weight  3lbs  3lbs  3lbs  no documentation  5lbs   Reps  10-15  10-15  10-15  no documentation  10-15   Time  10 Minutes  10 Minutes  10 Minutes  no documentation  no documentation       Interval Training    Row Name 10/10/17 1247 10/24/17 1717 11/07/17 1458 11/23/17 1604 12/05/17 1420   Interval Training  no documentation  No  No  No  No       Treadmill    Row Name 10/10/17 1247 10/24/17 1717 11/07/17 1458 11/23/17 1604 12/05/17 1420   MPH  3  no documentation  3.3  3.3  no documentation    Grade  0  no documentation  3  3  no documentation   Minutes  15  no documentation  15  15  no documentation   METs  3.3  no documentation  4.89  4.89  no documentation       Bike    Row Name 10/10/17 1247 10/24/17 1717 11/07/17 1458 11/23/17 1604 12/05/17 1420   Level  1  1.6  no documentation  1.6  1.6   Minutes  15  15  no documentation  15  15   METs  2.99  4.19  no documentation  4.19  4.22       NuStep    Row Name 10/10/17 1247 10/24/17 1717 11/07/17 1458 11/23/17 1604 12/05/17 1420   Level  no documentation  4  5  no documentation  5   SPM  no  documentation  90  90  no documentation  90   Minutes  no documentation  15  15  no documentation  15   METs  no documentation  3.1  3.6  no documentation  4.2       Home Exercise Plan    Row Name 10/10/17 1247 10/24/17 1717 11/07/17 1458 11/23/17 1604 12/05/17 1420   Plans to continue exercise at  no documentation  Home (comment)  Home (comment)  Home (comment)  Home (comment)   Frequency  no documentation  Add 2 additional days to program exercise sessions.  Add 2 additional days to program exercise sessions.  Add 2 additional days to program exercise sessions.  Add 2 additional days to program exercise sessions.   Initial Home Exercises Provided  no documentation  10/17/17  10/17/17  10/17/17  10/17/17          Exercise Comments: Exercise Comments    Row Name 10/10/17 1248 11/08/17 1507 12/06/17 1421   Exercise Comments  Pt was oriented to exercise equipment on 10/10/17. Pt was able to exercise safelty for 30 minutes without any sign/symptoms of physical distress. Willl progress pt's activity level as tolerated.  Reviewed METs and goals. Pt is tolerating exercise program very well; will continue to monitor activity levels and progress as tolerated.  Reviewed METs and goals. Pt is tolerating exercise program very well; will continue to monitor activity levels and progress as tolerated.      Exercise Goals and Review: Exercise  Goals    Exercise Goals    Row Name 10/04/17 0910   Increase Physical Activity  Yes   Intervention  Develop an individualized exercise prescription for aerobic and resistive training based on initial evaluation findings, risk stratification, comorbidities and participant's personal goals.;Provide advice, education, support and counseling about physical activity/exercise needs.   Expected Outcomes  Achievement of increased cardiorespiratory fitness and enhanced flexibility, muscular endurance and strength shown through measurements of functional capacity and personal statement of participant.   Increase Strength and Stamina  Yes   Intervention  Provide advice, education, support and counseling about physical activity/exercise needs.;Develop an individualized exercise prescription for aerobic and resistive training based on initial evaluation findings, risk stratification, comorbidities and participant's personal goals.   Expected Outcomes  Achievement of increased cardiorespiratory fitness and enhanced flexibility, muscular endurance and strength shown through measurements of functional capacity and personal statement of participant.   Able to understand and use rate of perceived exertion (RPE) scale  Yes   Intervention  Provide education and explanation on how to use RPE scale   Expected Outcomes  Short Term: Able to use RPE daily in rehab to express subjective intensity level;Long Term:  Able to use RPE to guide intensity level when exercising independently   Knowledge and understanding of Target Heart Rate Range (THRR)  Yes   Intervention  Provide education and explanation of THRR including how the numbers were predicted and where they are located for reference   Expected Outcomes  Short Term: Able to state/look up THRR;Long Term: Able to use THRR to govern intensity when exercising independently;Short Term: Able to use daily as guideline for intensity in rehab   Able to check pulse independently   Yes   Intervention  Provide education and demonstration on how to check pulse in carotid and radial arteries.;Review the importance of being able to check your own pulse for safety during independent exercise   Expected Outcomes  Short Term: Able to explain why pulse checking  is important during independent exercise;Long Term: Able to check pulse independently and accurately   Understanding of Exercise Prescription  Yes   Intervention  Provide education, explanation, and written materials on patient's individual exercise prescription   Expected Outcomes  Short Term: Able to explain program exercise prescription;Long Term: Able to explain home exercise prescription to exercise independently          Exercise Goals Re-Evaluation : Exercise Goals Re-Evaluation    Exercise Goal Re-Evaluation    Row Name 10/12/17 1250 10/17/17 1523 11/08/17 1511 12/07/17 0821   Exercise Goals Review  Increase Physical Activity;Understanding of Exercise Prescription;Able to understand and use rate of perceived exertion (RPE) scale  Increase Physical Activity;Able to understand and use rate of perceived exertion (RPE) scale;Knowledge and understanding of Target Heart Rate Range (THRR);Understanding of Exercise Prescription;Increase Strength and Stamina;Able to check pulse independently  Increase Physical Activity;Able to understand and use rate of perceived exertion (RPE) scale;Knowledge and understanding of Target Heart Rate Range (THRR);Understanding of Exercise Prescription;Increase Strength and Stamina;Able to check pulse independently  Increase Physical Activity;Able to understand and use rate of perceived exertion (RPE) scale;Knowledge and understanding of Target Heart Rate Range (THRR);Understanding of Exercise Prescription;Increase Strength and Stamina;Able to check pulse independently   Comments  Pt demonstrated proper use of RPE scale and has great understanding of exercise flow and order. Pt will continue to  exercise for 30 minutes symptom free.  Reviewed home exercise with pt today.  Pt plans to walk for exercise, 2-3x/week in addition to coming to cardiac rehab.  Reviewed THR, pulse, RPE, sign and symptoms, NTG use, and when to call 911 or MD.  Also discussed weather considerations and indoor options.  Pt voiced understanding.  Pt handles WL increases very well; pt has increased walking tolerance on treadmill and WL on Nustep machine. Pt is walking at home for exercise program.  Pt stated weight is trending down. Pt has also noticed an increase in muscle tone and is feeling stronger.   Expected Outcomes  Pt will continue to improve in cardiorespiratory fitness, functional capacity and overall well being  Pt will continue to improve in cardiorespiratory fitness, functional capacity by being compliant with HEP.  Pt will continue to improve in cardiorespiratory fitness, functional capacity by being compliant with HEP.  Pt will continue to improve in cardiorespiratory fitness, functional capacity by being compliant with HEP.           Discharge Exercise Prescription (Final Exercise Prescription Changes): Exercise Prescription Changes - 12/05/17 1420    Response to Exercise          Blood Pressure (Admit)  122/62    Blood Pressure (Exercise)  144/72    Blood Pressure (Exit)  112/66    Heart Rate (Admit)  74 bpm    Heart Rate (Exercise)  105 bpm    Heart Rate (Exit)  84 bpm    Rating of Perceived Exertion (Exercise)  12    Symptoms  11    Duration  Continue with 30 min of aerobic exercise without signs/symptoms of physical distress.    Intensity  THRR unchanged        Progression          Progression  Continue to progress workloads to maintain intensity without signs/symptoms of physical distress.    Average METs  4.2        Resistance Training          Training Prescription  Yes    Weight  5lbs  Reps  10-15        Interval Training          Interval Training  No        Bike           Level  1.6    Minutes  15    METs  4.22        NuStep          Level  5    SPM  90    Minutes  15    METs  4.2        Home Exercise Plan          Plans to continue exercise at  Home (comment)    Frequency  Add 2 additional days to program exercise sessions.    Initial Home Exercises Provided  10/17/17           Nutrition:  Target Goals: Understanding of nutrition guidelines, daily intake of sodium 1500mg , cholesterol 200mg , calories 30% from fat and 7% or less from saturated fats, daily to have 5 or more servings of fruits and vegetables.  Biometrics: Pre Biometrics - 10/04/17 1206    Pre Biometrics          Height  5' 11.25" (1.81 m)    Weight  209 lb 3.5 oz (94.9 kg)    Waist Circumference  41 inches    Hip Circumference  43.25 inches    Waist to Hip Ratio  0.95 %    BMI (Calculated)  28.97    Triceps Skinfold  15 mm    % Body Fat  28.2 %    Grip Strength  44 kg    Flexibility  12 in    Single Leg Stand  27.69 seconds            Nutrition Therapy Plan and Nutrition Goals: Nutrition Therapy & Goals - 10/05/17 1147    Nutrition Therapy          Diet  Heart Healthy        Personal Nutrition Goals          Nutrition Goal  Pt to identify food quantities necessary to achieve weight loss of 6-15 lb at graduation from cardiac rehab. Goal wt of 195 lb desired.         Intervention Plan          Intervention  Prescribe, educate and counsel regarding individualized specific dietary modifications aiming towards targeted core components such as weight, hypertension, lipid management, diabetes, heart failure and other comorbidities.    Expected Outcomes  Short Term Goal: Understand basic principles of dietary content, such as calories, fat, sodium, cholesterol and nutrients.;Long Term Goal: Adherence to prescribed nutrition plan.           Nutrition Assessments: Nutrition Assessments - 10/05/17 1147    MEDFICTS Scores          Pre Score  29            Nutrition Goals Re-Evaluation:   Nutrition Goals Re-Evaluation:   Nutrition Goals Discharge (Final Nutrition Goals Re-Evaluation):   Psychosocial: Target Goals: Acknowledge presence or absence of significant depression and/or stress, maximize coping skills, provide positive support system. Participant is able to verbalize types and ability to use techniques and skills needed for reducing stress and depression.  Initial Review & Psychosocial Screening: Initial Psych Review & Screening - 10/04/17 1027    Initial Review  Current issues with  None Identified        Family Dynamics          Good Support System?  Yes  (Significant)  Friend     Comments  no psychosocial needs identified, no interventions necessary         Barriers          Psychosocial barriers to participate in program  There are no identifiable barriers or psychosocial needs.        Screening Interventions          Interventions  Encouraged to exercise           Quality of Life Scores: Quality of Life - 11/07/17 1621    Quality of Life Scores          Health/Function Pre  25.73 %    Socioeconomic Pre  28.44 %    Psych/Spiritual Pre  24.21 %    Family Pre  27.1 %    GLOBAL Pre  26.24 % overall good scores. pt exhibits positve outlook with good coping skills.           Scores of 19 and below usually indicate a poorer quality of life in these areas.  A difference of  2-3 points is a clinically meaningful difference.  A difference of 2-3 points in the total score of the Quality of Life Index has been associated with significant improvement in overall quality of life, self-image, physical symptoms, and general health in studies assessing change in quality of life.  PHQ-9: Recent Review Flowsheet Data    Depression screen [facility] 2/9 10/10/2017   Decreased Interest 0   Down, Depressed, Hopeless 1   PHQ - 2 Score 1     Interpretation of Total Score  Total Score Depression Severity:  1-4 =  Minimal depression, 5-9 = Mild depression, 10-14 = Moderate depression, 15-19 = Moderately severe depression, 20-27 = Severe depression   Psychosocial Evaluation and Intervention: Psychosocial Evaluation - 10/10/17 1442    Psychosocial Evaluation & Interventions          Interventions  Encouraged to exercise with the program and follow exercise prescription;Stress management education;Relaxation education    Comments  pt with known history of depression, anxiety and PTSD ([place] Vet)  verbalizes improvement in his symptoms over past year. pt reports he has been off zoloft for 1 year.  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      Expected Outcomes  pt will exhibit positive outlook with good coping skills.     Continue Psychosocial Services   Follow up required by staff           Psychosocial Re-Evaluation: Psychosocial Re-Evaluation    Psychosocial Re-Evaluation    Row Name 11/10/17 1046 11/29/17 1404   Current issues with  None Identified  None Identified   Comments  no psychosocial needs identified, no interventions necessary   no psychosocial needs identified, no interventions necessary    Expected Outcomes  pt will exhibit positive outlook with good coping skills.   pt will exhibit positive outlook with good coping skills.    Interventions  Encouraged to attend Cardiac Rehabilitation for the exercise;Stress management education;Relaxation education  Encouraged to attend Cardiac Rehabilitation for the exercise;Stress management education;Relaxation education   Continue Psychosocial Services   No Follow up required  No Follow up required          Psychosocial Discharge (Final Psychosocial  Re-Evaluation): Psychosocial Re-Evaluation - 11/29/17 1404    Psychosocial Re-Evaluation          Current issues with  None Identified    Comments  no psychosocial needs identified, no interventions necessary     Expected  Outcomes  pt will exhibit positive outlook with good coping skills.     Interventions  Encouraged to attend Cardiac Rehabilitation for the exercise;Stress management education;Relaxation education    Continue Psychosocial Services   No Follow up required           Vocational Rehabilitation: Provide vocational rehab assistance to qualifying candidates.   Vocational Rehab Evaluation & Intervention: Vocational Rehab - 10/04/17 1027    Initial Vocational Rehab Evaluation & Intervention          Assessment shows need for Vocational Rehabilitation  No           Education: Education Goals: Education classes will be provided on a weekly basis, covering required topics. Participant will state understanding/return demonstration of topics presented.  Learning Barriers/Preferences: Learning Barriers/Preferences - 10/04/17 0911    Learning Barriers/Preferences          Learning Barriers  Sight    Learning Preferences  Verbal Instruction;Video;Written Material;Pictoral;Skilled Demonstration           Education Topics: Count Your Pulse:  -Group instruction provided by verbal instruction, demonstration, patient participation and written materials to support subject.  Instructors address importance of being able to find your pulse and how to count your pulse when at home without a heart monitor.  Patients get hands on experience counting their pulse with staff help and individually.   Heart Attack, Angina, and Risk Factor Modification:  -Group instruction provided by verbal instruction, video, and written materials to support subject.  Instructors address signs and symptoms of angina and heart attacks.    Also discuss risk factors for heart disease and how to make changes to improve heart health risk factors. Flowsheet Row CARDIAC REHAB PHASE II EXERCISE from 12/02/2017 in [facility] CARDIAC REHAB  Date  11/16/17  Instruction Review Code  2- Demonstrated Understanding       Functional Fitness:  -Group instruction provided by verbal instruction, demonstration, patient participation, and written materials to support subject.  Instructors address safety measures for doing things around the house.  Discuss how to get up and down off the floor, how to pick things up properly, how to safely get out of a chair without assistance, and balance training. Flowsheet Row CARDIAC REHAB PHASE II EXERCISE from 12/02/2017 in [facility] CARDIAC REHAB  Date  12/02/17  Instruction Review Code  2- Demonstrated Understanding      Meditation and Mindfulness:  -Group instruction provided by verbal instruction, patient participation, and written materials to support subject.  Instructor addresses importance of mindfulness and meditation practice to help reduce stress and improve awareness.  Instructor also leads participants through a meditation exercise.  Flowsheet Row CARDIAC REHAB PHASE II EXERCISE from 12/07/2017 in [facility] CARDIAC REHAB  Date  12/07/17  Educator  Theda Belfast  Instruction Review Code  2- Demonstrated Understanding      Stretching for Flexibility and Mobility:  -Group instruction provided by verbal instruction, patient participation, and written materials to support subject.  Instructors lead participants through series of stretches that are designed to increase flexibility thus improving mobility.  These stretches are additional exercise for major muscle groups that are typically performed during regular warm up and cool down.  Hands Only CPR:  -Group verbal, video, and participation provides a basic overview of AHA guidelines for community CPR. Role-play of emergencies allow participants the opportunity to practice calling for help and chest compression technique with discussion of AED use.   Hypertension: -Group verbal and written instruction that provides a basic overview of hypertension including the most recent  diagnostic guidelines, risk factor reduction with self-care instructions and medication management.    Nutrition I class: Heart Healthy Eating:  -Group instruction provided by PowerPoint slides, verbal discussion, and written materials to support subject matter. The instructor gives an explanation and review of the Therapeutic Lifestyle Changes diet recommendations, which includes a discussion on lipid goals, dietary fat, sodium, fiber, plant stanol/sterol esters, sugar, and the components of a well-balanced, healthy diet.   Nutrition II class: Lifestyle Skills:  -Group instruction provided by PowerPoint slides, verbal discussion, and written materials to support subject matter. The instructor gives an explanation and review of label reading, grocery shopping for heart health, heart healthy recipe modifications, and ways to make healthier choices when eating out.   Diabetes Question & Answer:  -Group instruction provided by PowerPoint slides, verbal discussion, and written materials to support subject matter. The instructor gives an explanation and review of diabetes co-morbidities, pre- and post-prandial blood glucose goals, pre-exercise blood glucose goals, signs, symptoms, and treatment of hypoglycemia and hyperglycemia, and foot care basics.   Diabetes Blitz:  -Group instruction provided by PowerPoint slides, verbal discussion, and written materials to support subject matter. The instructor gives an explanation and review of the physiology behind type 1 and type 2 diabetes, diabetes medications and rational behind using different medications, pre- and post-prandial blood glucose recommendations and Hemoglobin A1c goals, diabetes diet, and exercise including blood glucose guidelines for exercising safely.    Portion Distortion:  -Group instruction provided by PowerPoint slides, verbal discussion, written materials, and food models to support subject matter. The instructor gives an explanation  of serving size versus portion size, changes in portions sizes over the last 20 years, and what consists of a serving from each food group.   Stress Management:  -Group instruction provided by verbal instruction, video, and written materials to support subject matter.  Instructors review role of stress in heart disease and how to cope with stress positively.     Exercising on Your Own:  -Group instruction provided by verbal instruction, power point, and written materials to support subject.  Instructors discuss benefits of exercise, components of exercise, frequency and intensity of exercise, and end points for exercise.  Also discuss use of nitroglycerin and activating EMS.  Review options of places to exercise outside of rehab.  Review guidelines for sex with heart disease.   Cardiac Drugs I:  -Group instruction provided by verbal instruction and written materials to support subject.  Instructor reviews cardiac drug classes: antiplatelets, anticoagulants, beta blockers, and statins.  Instructor discusses reasons, side effects, and lifestyle considerations for each drug class.   Cardiac Drugs II:  -Group instruction provided by verbal instruction and written materials to support subject.  Instructor reviews cardiac drug classes: angiotensin converting enzyme inhibitors (ACE-I), angiotensin II receptor blockers (ARBs), nitrates, and calcium channel blockers.  Instructor discusses reasons, side effects, and lifestyle considerations for each drug class.   Anatomy and Physiology of the Circulatory System:  Group verbal and written instruction and models provide basic cardiac anatomy and physiology, with the coronary electrical and arterial systems. Review of: AMI, Angina, Valve disease, Heart Failure, Peripheral Artery Disease, Cardiac Arrhythmia,  Pacemakers, and the ICD.   Other Education:  -Group or individual verbal, written, or video instructions that support the educational goals of the  cardiac rehab program.   Holiday Eating Survival Tips:  -Group instruction provided by PowerPoint slides, verbal discussion, and written materials to support subject matter. The instructor gives patients tips, tricks, and techniques to help them not only survive but enjoy the holidays despite the onslaught of food that accompanies the holidays.   Knowledge Questionnaire Score: Knowledge Questionnaire Score - 10/04/17 1026    Knowledge Questionnaire Score          Pre Score  19/24           Core Components/Risk Factors/Patient Goals at Admission: Personal Goals and Risk Factors at Admission - 10/04/17 1207    Core Components/Risk Factors/Patient Goals on Admission           Weight Management  Yes;Weight Maintenance;Weight Loss    Intervention  Weight Management: Develop a combined nutrition and exercise program designed to reach desired caloric intake, while maintaining appropriate intake of nutrient and fiber, sodium and fats, and appropriate energy expenditure required for the weight goal.;Weight Management: Provide education and appropriate resources to help participant work on and attain dietary goals.;Weight Management/Obesity: Establish reasonable short term and long term weight goals.    Admit Weight  209 lb 3.5 oz (94.9 kg)    Goal Weight: Short Term  199 lb (90.3 kg)    Goal Weight: Long Term  195 lb (88.5 kg)    Expected Outcomes  Short Term: Continue to assess and modify interventions until short term weight is achieved;Long Term: Adherence to nutrition and physical activity/exercise program aimed toward attainment of established weight goal;Weight Maintenance: Understanding of the daily nutrition guidelines, which includes 25-35% calories from fat, 7% or less cal from saturated fats, less than 200mg  cholesterol, less than 1.5gm of sodium, & 5 or more servings of fruits and vegetables daily;Weight Loss: Understanding of general recommendations for a balanced deficit meal plan,  which promotes 1-2 lb weight loss per week and includes a negative energy balance of [phone] kcal/d;Understanding of distribution of calorie intake throughout the day with the consumption of 4-5 meals/snacks;Understanding recommendations for meals to include 15-35% energy as protein, 25-35% energy from fat, 35-60% energy from carbohydrates, less than 200mg  of dietary cholesterol, 20-35 gm of total fiber daily    Hypertension  Yes    Intervention  Provide education on lifestyle modifcations including regular physical activity/exercise, weight management, moderate sodium restriction and increased consumption of fresh fruit, vegetables, and low fat dairy, alcohol moderation, and smoking cessation.;Monitor prescription use compliance.    Expected Outcomes  Short Term: Continued assessment and intervention until BP is < 140/59mm HG in hypertensive participants. < 130/55mm HG in hypertensive participants with diabetes, heart failure or chronic kidney disease.;Long Term: Maintenance of blood pressure at goal levels.    Lipids  Yes    Intervention  Provide education and support for participant on nutrition & aerobic/resistive exercise along with prescribed medications to achieve LDL 70mg , HDL >40mg .    Expected Outcomes  Short Term: Participant states understanding of desired cholesterol values and is compliant with medications prescribed. Participant is following exercise prescription and nutrition guidelines.;Long Term: Cholesterol controlled with medications as prescribed, with individualized exercise RX and with personalized nutrition plan. Value goals: LDL < 70mg , HDL > 40 mg.           Core Components/Risk Factors/Patient Goals Review:  Goals and Risk Factor Review  Core Components/Risk Factors/Patient Goals Review    Row Name 10/10/17 1440 11/10/17 1039 11/29/17 1403   Personal Goals Review  Weight Management/Obesity;Lipids;Hypertension  Weight Management/Obesity;Lipids;Hypertension  Weight  Management/Obesity;Lipids;Hypertension   Review  pt with CAD RF demonstrates eagerness to participate in CR program. pt personal goal is to lose 10 lbs and increase social interaction.   pt with CAD RF demonstrates eagerness to participate in CR program. pt displays compliance with HTN and lipid management.    pt with CAD RF demonstrates eagerness to participate in CR program. pt displays compliance with HTN and lipid management.  pt with 6 lb weight loss.  pt has been doing stairs and walking at home.     Expected Outcomes  pt will participate in CR exercise, nutitrition and lifestyle modification to decrease overall RF.    pt will participate in CR exercise, nutitrition and lifestyle modification to decrease overall RF.    pt will participate in CR exercise, nutitrition and lifestyle modification to decrease overall RF.            Core Components/Risk Factors/Patient Goals at Discharge (Final Review):  Goals and Risk Factor Review - 11/29/17 1403    Core Components/Risk Factors/Patient Goals Review          Personal Goals Review  Weight Management/Obesity;Lipids;Hypertension    Review  pt with CAD RF demonstrates eagerness to participate in CR program. pt displays compliance with HTN and lipid management.  pt with 6 lb weight loss.  pt has been doing stairs and walking at home.      Expected Outcomes  pt will participate in CR exercise, nutitrition and lifestyle modification to decrease overall RF.             ITP Comments: ITP Comments    Row Name 10/04/17 0909 10/10/17 1437 11/10/17 1036 11/29/17 1403   ITP Comments  Dr. Armanda Magic, Medical Director  pt started group exercise sessions today. pt tolerated light activity without difficulty.   30 day ITP review. pt demonstrates willingness to participate in CR opportunitites with good attendance and participation.    30 day ITP review. pt demonstrates willingness to participate in CR opportunitites with good attendance and participation.         Comments:

## 2017-12-09 ENCOUNTER — Encounter (HOSPITAL_COMMUNITY)
Admission: RE | Admit: 2017-12-09 | Discharge: 2017-12-09 | Disposition: A | Discharge status: Home / Self Care | Payer: Non-veteran care | Source: Ambulatory Visit | Attending: Cardiovascular Disease | Admitting: Cardiovascular Disease

## 2017-12-09 DIAGNOSIS — I2111 ST elevation (STEMI) myocardial infarction involving right coronary artery: Secondary | ICD-10-CM | POA: Diagnosis not present

## 2017-12-09 DIAGNOSIS — Z955 Presence of coronary angioplasty implant and graft: Secondary | ICD-10-CM

## 2017-12-12 ENCOUNTER — Encounter (HOSPITAL_COMMUNITY)
Admission: RE | Admit: 2017-12-12 | Discharge: 2017-12-12 | Disposition: A | Discharge status: Home / Self Care | Payer: Non-veteran care | Source: Ambulatory Visit | Attending: Cardiovascular Disease | Admitting: Cardiovascular Disease

## 2017-12-12 DIAGNOSIS — Z955 Presence of coronary angioplasty implant and graft: Secondary | ICD-10-CM

## 2017-12-12 DIAGNOSIS — I2111 ST elevation (STEMI) myocardial infarction involving right coronary artery: Secondary | ICD-10-CM | POA: Diagnosis not present

## 2017-12-14 ENCOUNTER — Encounter (HOSPITAL_COMMUNITY)
Admission: RE | Admit: 2017-12-14 | Discharge: 2017-12-14 | Disposition: A | Discharge status: Home / Self Care | Payer: Non-veteran care | Source: Ambulatory Visit | Attending: Cardiovascular Disease | Admitting: Cardiovascular Disease

## 2017-12-14 DIAGNOSIS — I2111 ST elevation (STEMI) myocardial infarction involving right coronary artery: Secondary | ICD-10-CM | POA: Diagnosis not present

## 2017-12-14 DIAGNOSIS — Z955 Presence of coronary angioplasty implant and graft: Secondary | ICD-10-CM

## 2017-12-16 ENCOUNTER — Encounter (HOSPITAL_COMMUNITY)
Admission: RE | Admit: 2017-12-16 | Discharge: 2017-12-16 | Disposition: A | Discharge status: Home / Self Care | Payer: Non-veteran care | Source: Ambulatory Visit | Attending: Cardiovascular Disease | Admitting: Cardiovascular Disease

## 2017-12-16 DIAGNOSIS — I2111 ST elevation (STEMI) myocardial infarction involving right coronary artery: Secondary | ICD-10-CM | POA: Diagnosis not present

## 2017-12-16 DIAGNOSIS — Z955 Presence of coronary angioplasty implant and graft: Secondary | ICD-10-CM

## 2017-12-19 ENCOUNTER — Encounter (HOSPITAL_COMMUNITY)
Admission: RE | Admit: 2017-12-19 | Discharge: 2017-12-19 | Disposition: A | Discharge status: Home / Self Care | Payer: Non-veteran care | Source: Ambulatory Visit | Attending: Cardiovascular Disease | Admitting: Cardiovascular Disease

## 2017-12-19 DIAGNOSIS — Z79899 Other long term (current) drug therapy: Secondary | ICD-10-CM | POA: Insufficient documentation

## 2017-12-19 DIAGNOSIS — I2510 Atherosclerotic heart disease of native coronary artery without angina pectoris: Secondary | ICD-10-CM | POA: Diagnosis not present

## 2017-12-19 DIAGNOSIS — I2111 ST elevation (STEMI) myocardial infarction involving right coronary artery: Secondary | ICD-10-CM

## 2017-12-19 DIAGNOSIS — I10 Essential (primary) hypertension: Secondary | ICD-10-CM | POA: Insufficient documentation

## 2017-12-19 DIAGNOSIS — Z7982 Long term (current) use of aspirin: Secondary | ICD-10-CM | POA: Diagnosis not present

## 2017-12-19 DIAGNOSIS — I213 ST elevation (STEMI) myocardial infarction of unspecified site: Secondary | ICD-10-CM | POA: Diagnosis present

## 2017-12-19 DIAGNOSIS — Z955 Presence of coronary angioplasty implant and graft: Secondary | ICD-10-CM

## 2017-12-21 ENCOUNTER — Encounter (HOSPITAL_COMMUNITY)
Admission: RE | Admit: 2017-12-21 | Discharge: 2017-12-21 | Disposition: A | Discharge status: Home / Self Care | Payer: Non-veteran care | Source: Ambulatory Visit | Attending: Cardiovascular Disease | Admitting: Cardiovascular Disease

## 2017-12-21 VITALS — Ht 71.25 in | Wt 201.3 lb

## 2017-12-21 DIAGNOSIS — I2111 ST elevation (STEMI) myocardial infarction involving right coronary artery: Secondary | ICD-10-CM

## 2017-12-21 DIAGNOSIS — Z955 Presence of coronary angioplasty implant and graft: Secondary | ICD-10-CM

## 2017-12-23 ENCOUNTER — Encounter (HOSPITAL_COMMUNITY)
Admission: RE | Admit: 2017-12-23 | Discharge: 2017-12-23 | Disposition: A | Discharge status: Home / Self Care | Payer: Non-veteran care | Source: Ambulatory Visit | Attending: Cardiovascular Disease | Admitting: Cardiovascular Disease

## 2017-12-23 DIAGNOSIS — I2111 ST elevation (STEMI) myocardial infarction involving right coronary artery: Secondary | ICD-10-CM | POA: Diagnosis not present

## 2017-12-23 DIAGNOSIS — Z955 Presence of coronary angioplasty implant and graft: Secondary | ICD-10-CM

## 2017-12-26 ENCOUNTER — Encounter (HOSPITAL_COMMUNITY)
Admission: RE | Admit: 2017-12-26 | Discharge: 2017-12-26 | Disposition: A | Discharge status: Home / Self Care | Payer: Non-veteran care | Source: Ambulatory Visit | Attending: Cardiovascular Disease | Admitting: Cardiovascular Disease

## 2017-12-26 DIAGNOSIS — I2111 ST elevation (STEMI) myocardial infarction involving right coronary artery: Secondary | ICD-10-CM

## 2017-12-26 DIAGNOSIS — Z955 Presence of coronary angioplasty implant and graft: Secondary | ICD-10-CM

## 2017-12-27 ENCOUNTER — Telehealth: Payer: Self-pay

## 2017-12-27 NOTE — Telephone Encounter (Signed)
Pt had recent stent on 07/19/17 and should not interrupt DAPT unless urgent. I left a message for pt to call back to clear him for dental procedures requested. The work can only be done if it can be done while continuing DAPT. Otherwise he will need to wait until after 07/19/18 and then get clearance to interrupt DAPT from Dr. Excell Seltzer.

## 2017-12-27 NOTE — Telephone Encounter (Signed)
   [facility] Pre-operative Risk Assessment    Request for surgical clearance:  1. What type of surgery is being performed? Fillings, 2 crowns, upper/lower partials   2. When is this surgery scheduled? TBD   3. What type of clearance is required (medical clearance vs. Pharmacy clearance to hold med vs. Both)? both  4. Are there any medications that need to be held prior to surgery and how long?unknown   5. Practice name and name of physician performing surgery? [facility]   6. What is your office phone and fax number? [phone]  [phone]   7. Anesthesia type (None, local, MAC, general) ? local   Jason Cabrera 12/27/2017, 11:23 AM  _________________________________________________________________   (provider comments below)

## 2017-12-28 ENCOUNTER — Encounter (HOSPITAL_COMMUNITY): Payer: Self-pay

## 2017-12-28 ENCOUNTER — Encounter (HOSPITAL_COMMUNITY)
Admission: RE | Admit: 2017-12-28 | Discharge: 2017-12-28 | Disposition: A | Discharge status: Home / Self Care | Payer: Non-veteran care | Source: Ambulatory Visit | Attending: Cardiovascular Disease | Admitting: Cardiovascular Disease

## 2017-12-28 DIAGNOSIS — Z955 Presence of coronary angioplasty implant and graft: Secondary | ICD-10-CM

## 2017-12-28 DIAGNOSIS — I2111 ST elevation (STEMI) myocardial infarction involving right coronary artery: Secondary | ICD-10-CM | POA: Diagnosis not present

## 2017-12-29 NOTE — Telephone Encounter (Signed)
   Primary Cardiologist: Tonny Bollman, MD  Chart reviewed as part of pre-operative protocol coverage. Given past medical history and time since last visit, based on ACC/AHA guidelines, JAHMEIR HUSTER would be at acceptable risk for the planned procedure without further cardiovascular testing.   I will route this recommendation to the requesting party via Epic fax function and remove from pre-op pool.  HE MAY NOT STOP ASA OR BRILINTA FOR 12 MONTHS.  Please call with questions.  Roe Rutherford Javarie Crisp, PA 12/29/2017, 4:53 PM

## 2017-12-30 ENCOUNTER — Encounter (HOSPITAL_COMMUNITY): Payer: Non-veteran care

## 2018-01-05 NOTE — Progress Notes (Signed)
Discharge Progress Report  Patient Details  Name: Jason Cabrera MRN: [id number] Date of Birth: 1946/07/07 Referring Provider:   Flowsheet Row CARDIAC REHAB PHASE II ORIENTATION from 10/04/2017 in [facility]  Referring Provider  Sherren Mocha MD       Number of Visits: 36  Reason for Discharge:  Patient has met program and personal goals.  Smoking History:  Social History   Tobacco Use  Smoking Status Never Smoker  Smokeless Tobacco Never Used    Diagnosis:  ST elevation myocardial infarction involving right coronary artery (HCC)  Status post coronary artery stent placement  ADL [facility]:   Initial Exercise Prescription: Initial Exercise Prescription - 10/04/17 1100    Date of Initial Exercise RX and Referring Provider          Date  10/04/17    Referring Provider  Sherren Mocha MD        Treadmill          MPH  3    Grade  0    Minutes  15    METs  3.3        Bike          Level  1    Minutes  15    METs  2.99        NuStep          Level  3    SPM  80    Minutes  15    METs  2.2        Prescription Details          Frequency (times per week)  3    Duration  Progress to 30 minutes of continuous aerobic without signs/symptoms of physical distress        Intensity          THRR 40-80% of Max Heartrate  60-121    Ratings of Perceived Exertion  11-15    Perceived Dyspnea  0-4        Progression          Progression  Continue to progress workloads to maintain intensity without signs/symptoms of physical distress.        Resistance Training          Training Prescription  Yes    Weight  3lbs    Reps  10-15           Discharge Exercise Prescription (Final Exercise Prescription Changes): Exercise Prescription Changes - 12/28/17 1524    Response to Exercise          Blood Pressure (Admit)  108/60    Blood Pressure (Exercise)  120/78    Blood Pressure (Exit)  108/60    Heart Rate (Admit)  66 bpm     Heart Rate (Exercise)  103 bpm    Heart Rate (Exit)  72 bpm    Rating of Perceived Exertion (Exercise)  13    Symptoms  none    Duration  Continue with 30 min of aerobic exercise without signs/symptoms of physical distress.    Intensity  THRR unchanged        Progression          Progression  Continue to progress workloads to maintain intensity without signs/symptoms of physical distress.    Average METs  5.2        Resistance Training          Training Prescription  No relaxation day    Time  10 Minutes        Interval Training          Interval Training  No        Bike          Level  1.6    Minutes  16    METs  4.3        Rower          Level  5    Watts  71    Minutes  15    METs  6        Home Exercise Plan          Plans to continue exercise at  Home (comment)    Frequency  Add 2 additional days to program exercise sessions.    Initial Home Exercises Provided  10/17/17           Functional Capacity: 6 Minute Walk    6 Minute Walk    Row Name 10/04/17 1154 12/21/17 1619 12/21/17 1622   Phase  Initial  Discharge  no documentation   Distance  1870 feet  2100 feet  no documentation   Distance % Change  no documentation  no documentation  12.3 %   Distance Feet Change  no documentation  230 ft  no documentation   Walk Time  6 minutes  no documentation  no documentation   # of Rest Breaks  0  0  no documentation   MPH  3.54  4  no documentation   METS  3.87  4.4  no documentation   RPE  11  9  no documentation   VO2 Peak  13.57  no documentation  no documentation   Symptoms  No  No  no documentation   Resting HR  69 bpm  95 bpm  no documentation   Resting BP  132/70  120/70  no documentation   Resting Oxygen Saturation   99 %  no documentation  no documentation   Exercise Oxygen Saturation  during 6 min walk  100 %  no documentation  no documentation   Max Ex. HR  87 bpm  95 bpm  no documentation   Max Ex. BP  144/80  138/80  no documentation    2 Minute Post BP  108/60  no documentation  no documentation       6 Minute Walk    Row Name 12/21/17 1644 01/02/18 1529   Walk Time  no documentation  6 minutes   VO2 Peak  no documentation  15.4   2 Minute Post BP  130/70  no documentation          Psychological, QOL, Others - Outcomes: PHQ 2/9: Depression screen [facility] 2/9 12/28/2017 10/10/2017  Decreased Interest 0 0  Down, Depressed, Hopeless 0 1  PHQ - 2 Score 0 1    Quality of Life: Quality of Life - 01/02/18 1527    Quality of Life Scores          Health/Function Pre  25.73 %    Health/Function Post  26.17 %    Health/Function % Change  1.71 %    Socioeconomic Pre  28.44 %    Socioeconomic Post  23.21 %    Socioeconomic % Change   -18.39 %    Psych/Spiritual Pre  24.21 %    Psych/Spiritual Post  24.14 %    Psych/Spiritual % Change  -0.29 %    Family Pre  27.1 %  Family Post  24 %    Family % Change  -11.44 %    GLOBAL Post  24.93 %           Personal Goals: Goals established at orientation with interventions provided to work toward goal. Personal Goals and Risk Factors at Admission - 10/04/17 1207    Core Components/Risk Factors/Patient Goals on Admission           Weight Management  Yes;Weight Maintenance;Weight Loss    Intervention  Weight Management: Develop a combined nutrition and exercise program designed to reach desired caloric intake, while maintaining appropriate intake of nutrient and fiber, sodium and fats, and appropriate energy expenditure required for the weight goal.;Weight Management: Provide education and appropriate resources to help participant work on and attain dietary goals.;Weight Management/Obesity: Establish reasonable short term and long term weight goals.    Admit Weight  209 lb 3.5 oz (94.9 kg)    Goal Weight: Short Term  199 lb (90.3 kg)    Goal Weight: Long Term  195 lb (88.5 kg)    Expected Outcomes  Short Term: Continue to assess and modify interventions until short term  weight is achieved;Long Term: Adherence to nutrition and physical activity/exercise program aimed toward attainment of established weight goal;Weight Maintenance: Understanding of the daily nutrition guidelines, which includes 25-35% calories from fat, 7% or less cal from saturated fats, less than 247m cholesterol, less than 1.5gm of sodium, & 5 or more servings of fruits and vegetables daily;Weight Loss: Understanding of general recommendations for a balanced deficit meal plan, which promotes 1-2 lb weight loss per week and includes a negative energy balance of [phone] kcal/d;Understanding of distribution of calorie intake throughout the day with the consumption of 4-5 meals/snacks;Understanding recommendations for meals to include 15-35% energy as protein, 25-35% energy from fat, 35-60% energy from carbohydrates, less than 2051mof dietary cholesterol, 20-35 gm of total fiber daily    Hypertension  Yes    Intervention  Provide education on lifestyle modifcations including regular physical activity/exercise, weight management, moderate sodium restriction and increased consumption of fresh fruit, vegetables, and low fat dairy, alcohol moderation, and smoking cessation.;Monitor prescription use compliance.    Expected Outcomes  Short Term: Continued assessment and intervention until BP is < 140/909mG in hypertensive participants. < 130/58m54m in hypertensive participants with diabetes, heart failure or chronic kidney disease.;Long Term: Maintenance of blood pressure at goal levels.    Lipids  Yes    Intervention  Provide education and support for participant on nutrition & aerobic/resistive exercise along with prescribed medications to achieve LDL <70mg33mL >40mg.86mExpected Outcomes  Short Term: Participant states understanding of desired cholesterol values and is compliant with medications prescribed. Participant is following exercise prescription and nutrition guidelines.;Long Term: Cholesterol  controlled with medications as prescribed, with individualized exercise RX and with personalized nutrition plan. Value goals: LDL < 70mg, 54m> 40 mg.            Personal Goals Discharge: Goals and Risk Factor Review    Core Components/Risk Factors/Patient Goals Review    Row Name 10/10/17 1440 11/10/17 1039 11/29/17 1403 12/28/17 1705   Personal Goals Review  Weight Management/Obesity;Lipids;Hypertension  Weight Management/Obesity;Lipids;Hypertension  Weight Management/Obesity;Lipids;Hypertension  Weight Management/Obesity;Lipids;Hypertension   Review  pt with CAD RF demonstrates eagerness to participate in CR program. pt personal goal is to lose 10 lbs and increase social interaction.   pt with CAD RF demonstrates eagerness to participate in CR program. pt displays  compliance with HTN and lipid management.    pt with CAD RF demonstrates eagerness to participate in CR program. pt displays compliance with HTN and lipid management.  pt with 6 lb weight loss.  pt has been doing stairs and walking at home.    pt displays compliance with HTN and lipid management.  pt with 6 lb weight loss.  pt has been doing stairs and walking at home.    Expected Outcomes  pt will participate in CR exercise, nutitrition and lifestyle modification to decrease overall RF.    pt will participate in CR exercise, nutitrition and lifestyle modification to decrease overall RF.    pt will participate in CR exercise, nutitrition and lifestyle modification to decrease overall RF.    pt will participate in exercise, nutitrition and lifestyle modification to decrease overall RF.            Exercise Goals and Review: Exercise Goals    Exercise Goals    Row Name 10/04/17 0910   Increase Physical Activity  Yes   Intervention  Develop an individualized exercise prescription for aerobic and resistive training based on initial evaluation findings, risk stratification, comorbidities and participant's personal goals.;Provide advice,  education, support and counseling about physical activity/exercise needs.   Expected Outcomes  Achievement of increased cardiorespiratory fitness and enhanced flexibility, muscular endurance and strength shown through measurements of functional capacity and personal statement of participant.   Increase Strength and Stamina  Yes   Intervention  Provide advice, education, support and counseling about physical activity/exercise needs.;Develop an individualized exercise prescription for aerobic and resistive training based on initial evaluation findings, risk stratification, comorbidities and participant's personal goals.   Expected Outcomes  Achievement of increased cardiorespiratory fitness and enhanced flexibility, muscular endurance and strength shown through measurements of functional capacity and personal statement of participant.   Able to understand and use rate of perceived exertion (RPE) scale  Yes   Intervention  Provide education and explanation on how to use RPE scale   Expected Outcomes  Short Term: Able to use RPE daily in rehab to express subjective intensity level;Long Term:  Able to use RPE to guide intensity level when exercising independently   Knowledge and understanding of Target Heart Rate Range (THRR)  Yes   Intervention  Provide education and explanation of THRR including how the numbers were predicted and where they are located for reference   Expected Outcomes  Short Term: Able to state/look up THRR;Long Term: Able to use THRR to govern intensity when exercising independently;Short Term: Able to use daily as guideline for intensity in rehab   Able to check pulse independently  Yes   Intervention  Provide education and demonstration on how to check pulse in carotid and radial arteries.;Review the importance of being able to check your own pulse for safety during independent exercise   Expected Outcomes  Short Term: Able to explain why pulse checking is important during independent  exercise;Long Term: Able to check pulse independently and accurately   Understanding of Exercise Prescription  Yes   Intervention  Provide education, explanation, and written materials on patient's individual exercise prescription   Expected Outcomes  Short Term: Able to explain program exercise prescription;Long Term: Able to explain home exercise prescription to exercise independently          Nutrition & Weight - Outcomes: Pre Biometrics - 12/21/17 1620    Pre Biometrics          Height  5' 11.25" (1.81 m)  Weight  201 lb 4.5 oz (91.3 kg)    Waist Circumference  40 inches    Hip Circumference  41.75 inches    Waist to Hip Ratio  0.96 %    BMI (Calculated)  27.87    Triceps Skinfold  15 mm    % Body Fat  27.3 %    Grip Strength  49 kg    Flexibility  13.5 in    Single Leg Stand  16.75 seconds            Nutrition: Nutrition Therapy & Goals - 10/05/17 1147    Nutrition Therapy          Diet  Heart Healthy        Personal Nutrition Goals          Nutrition Goal  Pt to identify food quantities necessary to achieve weight loss of 6-15 lb at graduation from cardiac rehab. Goal wt of 195 lb desired.         Intervention Plan          Intervention  Prescribe, educate and counsel regarding individualized specific dietary modifications aiming towards targeted core components such as weight, hypertension, lipid management, diabetes, heart failure and other comorbidities.    Expected Outcomes  Short Term Goal: Understand basic principles of dietary content, such as calories, fat, sodium, cholesterol and nutrients.;Long Term Goal: Adherence to prescribed nutrition plan.           Nutrition Discharge: Nutrition Assessments - 01/04/18 1332    MEDFICTS Scores          Pre Score  29    Post Score  21    Score Difference  -8           Education Questionnaire Score: Knowledge Questionnaire Score - 01/02/18 1528    Knowledge Questionnaire Score          Post Score   22/24           Goals reviewed with patient; copy given to patient.

## 2018-07-26 ENCOUNTER — Telehealth: Payer: Self-pay | Admitting: Cardiovascular Disease

## 2018-07-26 NOTE — Telephone Encounter (Signed)
°  Called patient to offer appt with APP, schedule from recall. Left message [email or web address]

## 2018-09-11 ENCOUNTER — Ambulatory Visit: Payer: Medicare PPO | Admitting: Cardiology

## 2018-09-11 ENCOUNTER — Encounter: Payer: Self-pay | Admitting: Cardiology

## 2018-09-11 VITALS — BP 116/58 | HR 68 | Ht 71.25 in | Wt 209.4 lb

## 2018-09-11 DIAGNOSIS — E785 Hyperlipidemia, unspecified: Secondary | ICD-10-CM

## 2018-09-11 DIAGNOSIS — I10 Essential (primary) hypertension: Secondary | ICD-10-CM | POA: Diagnosis not present

## 2018-09-11 DIAGNOSIS — I2510 Atherosclerotic heart disease of native coronary artery without angina pectoris: Secondary | ICD-10-CM

## 2018-09-11 NOTE — Patient Instructions (Signed)
Medication Instructions:  Your physician recommends that you continue on your current medications as directed. Please refer to the Current Medication list given to you today.  If you need a refill on your cardiac medications before your next appointment, please call your pharmacy.   Lab work: None  If you have labs (blood work) drawn today and your tests are completely normal, you will receive your results only by: . MyChart Message (if you have MyChart) OR . A paper copy in the mail If you have any lab test that is abnormal or we need to change your treatment, we will call you to review the results.  Testing/Procedures: None  Follow-Up: At [facility], you and your health needs are our priority.  As part of our continuing mission to provide you with exceptional heart care, we have created designated Provider Care Teams.  These Care Teams include your primary Cardiologist (physician) and Advanced Practice Providers (APPs -  Physician Assistants and Nurse Practitioners) who all work together to provide you with the care you need, when you need it. You will need a follow up appointment in:  6 months.  Please call our office 2 months in advance to schedule this appointment.  You may see Michael Cooper, MD or one of the following Advanced Practice Providers on your designated Care Team: Scott Weaver, PA-C Vin Bhagat, PA-C . Janine Hammond, NP  Any Other Special Instructions Will Be Listed Below (If Applicable).    

## 2018-09-11 NOTE — Progress Notes (Signed)
Cardiology Office Note:    Date:  09/11/2018   ID:  Jason Cabrera, DOB 1946/04/03, MRN [id number]  PCP:  System, Pcp Not In  Cardiologist:  Tonny BollmanMichael Cooper, MD  Referring MD: No ref. provider found   Chief Complaint  Patient presents with  . Follow-up    CAD, HTN    History of Present Illness:    Jason Cabrera is a 72 y.o. male with a past medical history significant for CAD s/p DES to RCA 1996 & inferior STEMI with DES to in-stent restenosis RCA 06/2017, hyperlipidemia and hypertension. Admitted 10/30-11/1/18 with STEMI. Cath showed critical in-stent restenosis/thrombosis in a large, dominant RCA s/p successfully PCI using a 4.0 x 23 mm Sierra DES. Medical management for diffuse mild non obstructive CAD. Echo with normal LVEF. Peak of troponin 0.85. Started on lipitor.  He has been participating in cardiac rehab, maintenance program.  Mr Jason Cabrera is here today for follow up alone. He works [facility] at [employer] about [employer] times per week. He walks about 45 minutes with no exertional chest discomfort or shortness of breath. No palpitations, lightheadedness, orthopnea, PND, or edema.   He has lost about 30 pounds since his MI. He eats lots of vegetables and avoids fatty foods and processed foods.  His girlfriend really works on following a heart healthy diet for him.  Past Medical History:  Diagnosis Date  . 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 Coronary artery disease    a. cath 07/19/17 critical in-stent restenosis/thrombosis in a large, dominant RCA s/p successfully PCI using a 4.0 x 23 mm Sierra DES.. Medical managment for mild non obstructive CAD  . Hypertension     Past Surgical History:  Procedure Laterality Date  . CARDIAC SURGERY  1996   stents  . CORONARY/GRAFT ACUTE MI REVASCULARIZATION N/A 07/19/2017   Procedure: Coronary/Graft Acute MI Revascularization;  Surgeon: Tonny Bollmanooper, Michael, MD;  [facility];  Service: Cardiovascular;   Laterality: N/A;  . LEFT HEART CATH AND CORONARY ANGIOGRAPHY N/A 07/19/2017   Procedure: LEFT HEART CATH AND CORONARY ANGIOGRAPHY;  Surgeon: Tonny Bollmanooper, Michael, MD;  [facility];  Service: Cardiovascular;  Laterality: N/A;    Current Medications: Current Meds  Medication Sig  . aspirin EC 81 MG tablet Take 81 mg by mouth daily.  Marland Kitchen. atorvastatin (LIPITOR) 80 MG tablet Take 1 tablet (80 mg total) by mouth daily at 6 PM.  . lisinopril (PRINIVIL,ZESTRIL) 10 MG tablet Take 1 tablet (10 mg total) by mouth daily.  Marland Kitchen. MELATONIN PO Take 100 mg by mouth daily.  . metoprolol tartrate (LOPRESSOR) 50 MG tablet Take 50 mg by mouth 2 (two) times daily.  . nitroGLYCERIN (NITROSTAT) 0.4 MG SL tablet Place 1 tablet (0.4 mg total) under the tongue every 5 (five) minutes x 3 doses as needed for chest pain.  Marland Kitchen. rOPINIRole (REQUIP) 0.5 MG tablet Take 0.5 mg by mouth at bedtime.  . sertraline (ZOLOFT) 50 MG tablet Take 50 mg by mouth daily.  . tadalafil (CIALIS) 10 MG tablet Take 10 mg by mouth daily as needed for erectile dysfunction.     Allergies:   Patient has no known allergies.   Social History   Socioeconomic History  . Marital status: Divorced    Spouse name: Not on file  . Number of children: Not on file  . Years of education: Not on file  . Highest education level: Not on file  Occupational History  . Not on file  Social Needs  . Financial resource strain: Not on file  . Food insecurity:    Worry: Not on file    Inability: Not on file  . Transportation needs:    Medical: Not on file    Non-medical: Not on file  Tobacco Use  . Smoking status: Never Smoker  . Smokeless tobacco: Never Used  Substance and Sexual Activity  . Alcohol use: No  . Drug use: No  . Sexual activity: Not on file  Lifestyle  . Physical activity:    Days per week: Not on file    Minutes per session: Not on file  . Stress: Not on file  Relationships  . Social connections:    Talks on phone: Not on  file    Gets together: Not on file    Attends religious service: Not on file    Active member of club or organization: Not on file    Attends meetings of clubs or organizations: Not on file    Relationship status: Not on file  Other Topics Concern  . Not on file  Social History Narrative  . Not on file     Family History: The patient's family history includes Colon cancer in his brother; Hypertension in his mother; Lung cancer in his father; Stroke in his brother and mother. ROS:   Please see the history of present illness.     All other systems reviewed and are negative.  EKGs/Labs/Other Studies Reviewed:    The following studies were reviewed today:  Echo 07-20-2017: Study Conclusions - Left ventricle: The cavity size was normal. Wall thickness was increased in a pattern of moderate LVH. Systolic function was normal. The estimated ejection fraction was in the range of 60% to 65%. Wall motion was normal; there were no regional wall motion abnormalities. Left ventricular diastolic function parameters were normal. - Left atrium: The atrium was normal in size. - Inferior vena cava: The vessel was normal in size. The respirophasic diameter changes were in the normal range (>= 50%), consistent with normal central venous pressure.  Impressions: - LVEF 60-65%, normal wall motion, mild to moderate LVH, normal diastolic function, normal LA size, normal IVC.  EKG:  EKG is ordered today.  The ekg ordered today demonstrates NSR, 68 bpm, no longer has T inversions  Recent Labs: No results found for requested labs within last 8760 hours.   Recent Lipid Panel    Component Value Date/Time   CHOL 143 07/20/2017 0203   TRIG 107 07/20/2017 0203   HDL 45 07/20/2017 0203   CHOLHDL 3.2 07/20/2017 0203   VLDL 21 07/20/2017 0203   LDLCALC 77 07/20/2017 0203    Physical Exam:    VS:  BP (!) 116/58   Pulse 68   Ht 5' 11.25" (1.81 m)   Wt 209 lb 6.4 oz (95 kg)    SpO2 98%   BMI 29.00 kg/m     Wt Readings from Last 3 Encounters:  09/11/18 209 lb 6.4 oz (95 kg)  12/21/17 201 lb 4.5 oz (91.3 kg)  11/04/17 207 lb 1.9 oz (93.9 kg)     Physical Exam  Constitutional: He is oriented to person, place, and time. He appears well-developed and well-nourished. No distress.  HENT:  Head: Normocephalic and atraumatic.  Neck: Normal range of motion. Neck supple. No JVD present.  Cardiovascular: Normal rate, regular rhythm, normal heart sounds and intact distal pulses. Exam reveals no gallop and no friction rub.  No murmur heard. Pulmonary/Chest: Effort  normal and breath sounds normal. No respiratory distress. He has no wheezes. He has no rales.  Abdominal: Soft. Bowel sounds are normal.  Musculoskeletal: Normal range of motion.        General: No deformity or edema.  Neurological: He is alert and oriented to person, place, and time.  Skin: Skin is warm and dry.  Psychiatric: He has a normal mood and affect. His behavior is normal. Judgment and thought content normal.  Vitals reviewed.   ASSESSMENT:    1. Coronary artery disease involving native coronary artery of native heart without angina pectoris   2. Hyperlipidemia LDL goal <70   3. Essential hypertension    PLAN:    In order of problems listed above:  Coronary artery disease -History of remote stent and then STEMI due to in-stent restenosis in 06/2017. -He continues on aspirin, high intensity statin, beta-blocker and ACE inhibitor. His Brilinta was stopped after a year at the [place]. I will review this with Dr. Excell Seltzer.  -Pt is exercising and has no anginal symptoms.  Following a heart healthy diet  Hyperlipidemia -On high intensity statin -Lipids followed at the [facility]  Hypertension -On lisinopril and metoprolol. -BP initially elevated on arrival, but down after sitting.  -continue current therapy.   Medication Adjustments/Labs and Tests Ordered: Current medicines are reviewed at length with  the patient today.  Concerns regarding medicines are outlined above. Labs and tests ordered and medication changes are outlined in the patient instructions below:  Patient Instructions  Medication Instructions:  Your physician recommends that you continue on your current medications as directed. Please refer to the Current Medication list given to you today.  If you need a refill on your cardiac medications before your next appointment, please call your pharmacy.   Lab work: None  If you have labs (blood work) drawn today and your tests are completely normal, you will receive your results only by: Marland Kitchen MyChart Message (if you have MyChart) OR . A paper copy in the mail If you have any lab test that is abnormal or we need to change your treatment, we will call you to review the results.  Testing/Procedures: None  Follow-Up: At [facility], you and your health needs are our priority.  As part of our continuing mission to provide you with exceptional heart care, we have created designated Provider Care Teams.  These Care Teams include your primary Cardiologist (physician) and Advanced Practice Providers (APPs -  Physician Assistants and Nurse Practitioners) who all work together to provide you with the care you need, when you need it. You will need a follow up appointment in:  6 months.  Please call our office 2 months in advance to schedule this appointment.  You may see Tonny Bollman, MD or one of the following Advanced Practice Providers on your designated Care Team: Tereso Newcomer, PA-C [place], [place] . Berton Bon, NP  Any Other Special Instructions Will Be Listed Below (If Applicable).       Signed, Berton Bon, NP  09/11/2018 3:58 PM     [facility]

## 2020-06-17 NOTE — Progress Notes (Signed)
Cardiology Office Note:    Date:  06/18/2020   ID:  Rami, Budhu 1946/08/06, MRN [id number]  PCP:  Aviva Kluver  [facility] Cardiologist:  Tonny Bollman, MD  [facility] Electrophysiologist:  None   Referring MD: No ref. provider found   Chief Complaint:  Follow-up (CAD)    Patient Profile:    Jason Cabrera is a 74 y.o. male with:   Coronary artery disease   S/p PCI to RCA in 1996  S/p inf STEMI in 10/18 tx with DES to RCA 2/2 ISR  Hypertension   Hyperlipidemia   Prior CV studies: Echocardiogram 07/20/17 Mod LVH, EF 60-65, no RWMA  Cardiac catheterization 07/19/17 1.  Acute inferoposterior MI secondary to critical mid RCA in-stent restenosis/thrombosis, treated successfully with PCI using a 4.0 x 23 mm Sierra DES 2.  Mild diffuse nonobstructive coronary artery disease as detailed above 3.  Mild segmental contraction abnormality the left ventricle with normal LVEDP and preserved LVEF   History of Present Illness:    Mr. Frankl is a [place] veteran who served 2 tours with the [employer].  His primary care physician is Dr. Allena Katz at [facility].  He was last seen in clinic by Berton Bon, NP in 12/19.  He returns for follow up.  He is doing well.  He remains active.  He walks every day.  He has not had chest pain, shortness of breath, syncope, orthopnea, leg swelling.  He has not had claudication symptoms.      Past Medical History:  Diagnosis Date  . Coronary artery disease    S/p PCI to the RCA in 1996 // cath 07/19/17 critical in-stent restenosis/thrombosis in a large, dominant RCA s/p successfully PCI using a 4.0 x 23 mm Sierra DES.. Medical managment for mild non obstructive CAD  . History of inferior STEMI secondary to in-stent restenosis 07/19/2017  . Hyperlipidemia   . Hypertension     Current Medications: Current Meds  Medication Sig  . aspirin EC 81 MG tablet Take 81 mg by mouth daily.  Marland Kitchen atorvastatin (LIPITOR) 80 MG  tablet Take 1 tablet (80 mg total) by mouth daily at 6 PM.  . lisinopril (PRINIVIL,ZESTRIL) 10 MG tablet Take 1 tablet (10 mg total) by mouth daily.  Marland Kitchen MELATONIN PO Take 100 mg by mouth daily.  . metoprolol tartrate (LOPRESSOR) 50 MG tablet Take 50 mg by mouth 2 (two) times daily.  . nitroGLYCERIN (NITROSTAT) 0.4 MG SL tablet Place 1 tablet (0.4 mg total) under the tongue every 5 (five) minutes x 3 doses as needed for chest pain.  Marland Kitchen rOPINIRole (REQUIP) 0.5 MG tablet Take 0.5 mg by mouth at bedtime.  . sertraline (ZOLOFT) 50 MG tablet Take 50 mg by mouth daily.  . tadalafil (CIALIS) 10 MG tablet Take 10 mg by mouth daily as needed for erectile dysfunction.     Allergies:   Patient has no known allergies.   Social History   Tobacco Use  . Smoking status: Never Smoker  . Smokeless tobacco: Never Used  Substance Use Topics  . Alcohol use: No  . Drug use: No     Family Hx: The patient's family history includes Colon cancer in his brother; Hypertension in his mother; Lung cancer in his father; Stroke in his brother and mother.  ROS  See HPI  EKGs/Labs/Other Test Reviewed:    EKG:  EKG is  ordered today.  The ekg ordered today demonstrates normal sinus rhythm, heart rate  66, normal axis, nonspecific ST-T wave changes, QTC 400, similar to prior tracing  Recent Labs: No results found for requested labs within last 8760 hours.   Recent Lipid Panel Lab Results  Component Value Date/Time   CHOL 143 07/20/2017 02:03 AM   TRIG 107 07/20/2017 02:03 AM   HDL 45 07/20/2017 02:03 AM   CHOLHDL 3.2 07/20/2017 02:03 AM   LDLCALC 77 07/20/2017 02:03 AM    Physical Exam:    VS:  BP 126/80   Pulse 74   Ht 6' (1.829 m)   Wt 205 lb 6.4 oz (93.2 kg)   SpO2 98%   BMI 27.86 kg/m     Wt Readings from Last 3 Encounters:  06/18/20 205 lb 6.4 oz (93.2 kg)  09/11/18 209 lb 6.4 oz (95 kg)  12/21/17 201 lb 4.5 oz (91.3 kg)     Constitutional:      Appearance: Healthy appearance. Not in  distress.  Neck:     Thyroid: No thyromegaly.     Vascular: JVD normal.  Pulmonary:     Effort: Pulmonary effort is normal.     Breath sounds: No wheezing. No rales.  Cardiovascular:     Normal rate. Regular rhythm. Normal S1. Normal S2.     Murmurs: There is no murmur.  Pulses:    Intact distal pulses.  Edema:    Peripheral edema absent.  Abdominal:     Palpations: Abdomen is soft. There is no hepatomegaly.  Skin:    General: Skin is warm and dry.  Neurological:     General: No focal deficit present.     Mental Status: Alert and oriented to person, place and time.     Cranial Nerves: Cranial nerves are intact.      ASSESSMENT & PLAN:    1. Coronary artery disease involving native coronary artery of native heart without angina pectoris History of PCI to the RCA in 1996 and subsequent inferior STEMI secondary to in-stent restenosis in October 2018 treated with a DES.  EF is preserved.  Overall, he has been doing well without anginal symptoms.  Continue aspirin, atorvastatin, metoprolol, lisinopril.  2. Essential hypertension The patient's blood pressure is controlled on his current regimen.  Continue current therapy.   3. Hyperlipidemia LDL goal <70 Continue high intensity statin therapy.  His labs are obtained at the [facility].  We will obtain the most recent lab report.    Dispo:  Return in about 1 year (around 06/18/2021) for Routine Follow Up, w/ Dr. Excell Seltzer, in person.   Medication Adjustments/Labs and Tests Ordered: Current medicines are reviewed at length with the patient today.  Concerns regarding medicines are outlined above.  Tests Ordered: Orders Placed This Encounter  Procedures  . EKG 12-Lead   Medication Changes: No orders of the defined types were placed in this encounter.   Signed, Tereso Newcomer, PA-C  06/18/2020 11:47 AM    [facility] [address] [place], [place], [place]  [address] Phone: [phone]; Fax: [phone]

## 2020-06-18 ENCOUNTER — Encounter: Payer: Self-pay | Admitting: Physician Assistant

## 2020-06-18 ENCOUNTER — Ambulatory Visit: Payer: Medicare PPO | Admitting: Physician Assistant

## 2020-06-18 ENCOUNTER — Other Ambulatory Visit: Payer: Self-pay

## 2020-06-18 VITALS — BP 126/80 | HR 74 | Ht 72.0 in | Wt 205.4 lb

## 2020-06-18 DIAGNOSIS — I10 Essential (primary) hypertension: Secondary | ICD-10-CM

## 2020-06-18 DIAGNOSIS — I2510 Atherosclerotic heart disease of native coronary artery without angina pectoris: Secondary | ICD-10-CM | POA: Diagnosis not present

## 2020-06-18 DIAGNOSIS — E785 Hyperlipidemia, unspecified: Secondary | ICD-10-CM | POA: Diagnosis not present

## 2020-06-18 NOTE — Patient Instructions (Signed)
Medication Instructions:  Your physician recommends that you continue on your current medications as directed. Please refer to the Current Medication list given to you today.  *If you need a refill on your cardiac medications before your next appointment, please call your pharmacy*  Lab Work: None ordered today  Testing/Procedures: None ordered today  Follow-Up: At [facility], you and your health needs are our priority.  As part of our continuing mission to provide you with exceptional heart care, we have created designated Provider Care Teams.  These Care Teams include your primary Cardiologist (physician) and Advanced Practice Providers (APPs -  Physician Assistants and Nurse Practitioners) who all work together to provide you with the care you need, when you need it.  Your next appointment:   12 month(s)  The format for your next appointment:   In Person  Provider:   Michael Cooper, MD   

## 2021-06-18 DIAGNOSIS — I2510 Atherosclerotic heart disease of native coronary artery without angina pectoris: Secondary | ICD-10-CM | POA: Insufficient documentation

## 2021-06-18 NOTE — Progress Notes (Signed)
Cardiology Office Note:    Date:  06/19/2021   ID:  Trayce, Maino 1945/10/12, MRN [id number]  PCP:  [facility]   [facility] Providers Cardiologist:  Tonny Bollman, MD Cardiology APP:  Kennon Rounds     Referring MD: No ref. provider found   Chief Complaint:  Follow-up for CAD    Patient Profile:   Jason Cabrera is a 75 y.o. male with:  Coronary artery disease  S/p PCI to RCA in 1996 S/p inf STEMI in 10/18 tx with DES to RCA 2/2 ISR Hypertension  Hyperlipidemia    Prior CV studies: Echocardiogram 07/20/17 Mod LVH, EF 60-65, no RWMA   Cardiac catheterization 07/19/17 1.  Acute inferoposterior MI secondary to critical mid RCA in-stent restenosis/thrombosis, treated successfully with PCI using a 4.0 x 23 mm Sierra DES 2.  Mild diffuse nonobstructive coronary artery disease as detailed above 3.  Mild segmental contraction abnormality the left ventricle with normal LVEDP and preserved LVEF   History of Present Illness: Jason Cabrera is a [place] veteran who served 2 tours with the [employer].  His primary care physician is Dr. Allena Katz at [facility].  He was last seen in 9/21.  He returns for f/u.  He is here alone.  He has been doing well.  He has recently lost weight.  He goes for walks on a regular basis.  He has not had chest pain, shortness of breath, syncope, orthopnea, leg edema.        Past Medical History:  Diagnosis Date   Coronary artery disease    S/p PCI to the RCA in 1996 // cath 07/19/17 critical in-stent restenosis/thrombosis in a large, dominant RCA s/p successfully PCI using a 4.0 x 23 mm Sierra DES.. 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 07/19/2017   Hyperlipidemia    Hypertension    Current Medications: Current Meds  Medication Sig   aspirin EC 81 MG tablet Take 81 mg by mouth daily.   atorvastatin (LIPITOR) 80 MG tablet Take 1 tablet (80 mg  total) by mouth daily at 6 PM.   lisinopril (PRINIVIL,ZESTRIL) 10 MG tablet Take 1 tablet (10 mg total) by mouth daily.   melatonin 3 MG TABS tablet TAKE 3 TABLETS/CAPSULES (9MG ) BY MOUTH AT BEDTIME   metoprolol tartrate (LOPRESSOR) 50 MG tablet Take 50 mg by mouth 2 (two) times daily.   nitroGLYCERIN (NITROSTAT) 0.4 MG SL tablet Place 1 tablet (0.4 mg total) under the tongue every 5 (five) minutes x 3 doses as needed for chest pain.   rOPINIRole (REQUIP) 0.5 MG tablet Take 0.5 mg by mouth at bedtime.   sertraline (ZOLOFT) 50 MG tablet Take 50 mg by mouth daily.   tadalafil (CIALIS) 10 MG tablet Take 10 mg by mouth daily as needed for erectile dysfunction.    Allergies:   Atorvastatin   Social History   Tobacco Use   Smoking status: Never   Smokeless tobacco: Never  Substance Use Topics   Alcohol use: No   Drug use: No    Family Hx: The patient's family history includes Colon cancer in his brother; Hypertension in his mother; Lung cancer in his father; Stroke in his brother and mother.  Review of Systems  Cardiovascular:  Negative for claudication.  Musculoskeletal:  Positive for back pain (sciatica).    EKGs/Labs/Other Test Reviewed:    EKG:  EKG is  ordered today.  The ekg ordered today demonstrates NSR, HR 68, normal axis, low voltage, no ST-T wave changes, QTC 412, similar to prior tracing  Recent Labs: No results found for requested labs within last 8760 hours.   Recent Lipid Panel Lab Results  Component Value Date/Time   CHOL 143 07/20/2017 02:03 AM   TRIG 107 07/20/2017 02:03 AM   HDL 45 07/20/2017 02:03 AM   LDLCALC 77 07/20/2017 02:03 AM     Risk Assessment/Calculations:          Physical Exam:    VS:  BP 102/60   Pulse 68   Ht 6' (1.829 m)   Wt 198 lb 12.8 oz (90.2 kg)   SpO2 98%   BMI 26.96 kg/m     Wt Readings from Last 3 Encounters:  06/19/21 198 lb 12.8 oz (90.2 kg)  06/18/20 205 lb 6.4 oz (93.2 kg)  09/11/18 209 lb 6.4 oz (95 kg)     Constitutional:      Appearance: Healthy appearance. Not in distress.  Neck:     Vascular: No carotid bruit. JVD normal.  Pulmonary:     Effort: Pulmonary effort is normal.     Breath sounds: No wheezing. No rales.  Cardiovascular:     Normal rate. Regular rhythm. Normal S1. Normal S2.      Murmurs: There is no murmur.  Edema:    Peripheral edema absent.  Abdominal:     Palpations: Abdomen is soft.  Skin:    General: Skin is warm and dry.  Neurological:     General: No focal deficit present.     Mental Status: Alert and oriented to person, place and time.     Cranial Nerves: Cranial nerves are intact.     ASSESSMENT & PLAN:   1. Coronary artery disease involving native coronary artery of native heart without angina pectoris History of PCI to the RCA 1996 and an inferior STEMI in 2018 treated with DES to the RCA.  He has been doing well without anginal symptoms.  He remains on aspirin, atorvastatin, metoprolol tartrate.  Follow-up in 1 year.  2. Essential hypertension Blood pressure is well controlled on a combination of lisinopril, metoprolol tartrate.  3. Hyperlipidemia LDL goal <70 He remains on high-dose statin therapy with atorvastatin.  We will request most recent labs from his provider at the [place] in [place].     Dispo:  Return in about 1 year (around 06/19/2022) for Routine 1 year follow up with Dr. Excell Seltzer or [facility]. .   Medication Adjustments/Labs and Tests Ordered: Current medicines are reviewed at length with the patient today.  Concerns regarding medicines are outlined above.  Tests Ordered: Orders Placed This Encounter  Procedures   EKG 12-Lead   Medication Changes: No orders of the defined types were placed in this encounter.  Signed, Tereso Newcomer, PA-C  06/19/2021 10:22 AM    [facility] [address] [place], [place], [place]  [address] Phone: [phone]; Fax: [phone]

## 2021-06-19 ENCOUNTER — Other Ambulatory Visit: Payer: Self-pay

## 2021-06-19 ENCOUNTER — Ambulatory Visit: Payer: Medicare PPO | Admitting: Physician Assistant

## 2021-06-19 ENCOUNTER — Encounter: Payer: Self-pay | Admitting: Physician Assistant

## 2021-06-19 VITALS — BP 102/60 | HR 68 | Ht 72.0 in | Wt 198.8 lb

## 2021-06-19 DIAGNOSIS — I2510 Atherosclerotic heart disease of native coronary artery without angina pectoris: Secondary | ICD-10-CM

## 2021-06-19 DIAGNOSIS — E785 Hyperlipidemia, unspecified: Secondary | ICD-10-CM

## 2021-06-19 DIAGNOSIS — I10 Essential (primary) hypertension: Secondary | ICD-10-CM | POA: Diagnosis not present

## 2021-06-19 NOTE — Patient Instructions (Signed)
Medication Instructions:   Your physician recommends that you continue on your current medications as directed. Please refer to the Current Medication list given to you today.  *If you need a refill on your cardiac medications before your next appointment, please call your pharmacy*  Lab Work:  -NONE-  If you have labs (blood work) drawn today and your tests are completely normal, you will receive your results only by: MyChart Message (if you have MyChart) OR A paper copy in the mail If you have any lab test that is abnormal or we need to change your treatment, we will call you to review the results.  Testing/Procedures:  -NONE-  Follow-Up: At [facility], you and your health needs are our priority.  As part of our continuing mission to provide you with exceptional heart care, we have created designated Provider Care Teams.  These Care Teams include your primary Cardiologist (physician) and Advanced Practice Providers (APPs -  Physician Assistants and Nurse Practitioners) who all work together to provide you with the care you need, when you need it.  We recommend signing up for the patient portal called "MyChart".  Sign up information is provided on this After Visit Summary.  MyChart is used to connect with patients for Virtual Visits (Telemedicine).  Patients are able to view lab/test results, encounter notes, upcoming appointments, etc.  Non-urgent messages can be sent to your provider as well.   To learn more about what you can do with MyChart, go to [email or web address].    Your next appointment:   1 year(s)  The format for your next appointment:   In Person  Provider:   You may see Tonny Bollman, MD or one of the following Advanced Practice Providers on your designated Care Team:   Tereso Newcomer, PA-C  Other Instructions Your physician wants you to follow-up in: 1 year with Dr. Excell Seltzer or [facility].  You will receive a reminder letter in the mail two months in  advance. If you don't receive a letter, please call our office to schedule the follow-up appointment.  Please ask your provider at [facility] to fax @ [phone] Recent Lipids/bmet/lft.

## 2022-11-11 ENCOUNTER — Ambulatory Visit: Payer: Medicare PPO | Attending: Cardiovascular Disease | Admitting: Cardiovascular Disease

## 2022-11-11 ENCOUNTER — Encounter: Payer: Self-pay | Admitting: Cardiovascular Disease

## 2022-11-11 VITALS — BP 128/82 | HR 97 | Ht 73.0 in | Wt 195.4 lb

## 2022-11-11 DIAGNOSIS — E785 Hyperlipidemia, unspecified: Secondary | ICD-10-CM

## 2022-11-11 DIAGNOSIS — I2510 Atherosclerotic heart disease of native coronary artery without angina pectoris: Secondary | ICD-10-CM | POA: Diagnosis not present

## 2022-11-11 DIAGNOSIS — I10 Essential (primary) hypertension: Secondary | ICD-10-CM

## 2022-11-11 NOTE — Progress Notes (Signed)
Cardiology Office Note:    Date:  11/11/2022   ID:  Jason, Cabrera Feb 18, 1946, MRN [id number]  PCP:  [facility] Cardiologist:  Sherren Mocha, MD Cardiology APP:  Sharmon Revere     Referring MD: [facility]   Chief Complaint  Patient presents with   Coronary Artery Disease    History of Present Illness:    Jason Cabrera is a 77 y.o. male with a hx of: Coronary artery disease  S/p PCI to RCA in 1996 S/p inf STEMI in 10/18 tx with DES to RCA 2/2 ISR Hypertension  Hyperlipidemia   The patient is here alone today.  He is doing very well.  He walks about 3 miles per day without exertional symptoms.  His medications have not changed and he feels well.  He has lost about 20 pounds per his report and he has noticed improved exercise capacity and sense of wellbeing with his weight loss. Today, he denies symptoms of palpitations, chest pain, shortness of breath, orthopnea, PND, lower extremity edema, dizziness, or syncope.  Past Medical History:  Diagnosis Date   Coronary artery disease    S/p PCI to the RCA in 1996 // cath 07/19/17 critical in-stent restenosis/thrombosis in a large, dominant RCA s/p successfully PCI using a 4.0 x 23 mm Sierra DES.. 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 07/19/2017   Hyperlipidemia    Hypertension     Past Surgical History:  Procedure Laterality Date   [place]   stents   CORONARY/GRAFT ACUTE MI REVASCULARIZATION N/A 07/19/2017   Procedure: Coronary/Graft Acute MI Revascularization;  Surgeon: Sherren Mocha, MD;  Location: [place] [facility];  Service: Cardiovascular;  Laterality: N/A;   LEFT HEART CATH AND CORONARY ANGIOGRAPHY N/A 07/19/2017   Procedure: LEFT HEART CATH AND CORONARY ANGIOGRAPHY;  Surgeon: Sherren Mocha, MD;  Location: [place] [facility];  Service: Cardiovascular;  Laterality: N/A;     Current Medications: Current Meds  Medication Sig   aspirin EC 81 MG tablet Take 81 mg by mouth daily.   atorvastatin (LIPITOR) 80 MG tablet Take 1 tablet (80 mg total) by mouth daily at 6 PM.   lisinopril (PRINIVIL,ZESTRIL) 10 MG tablet Take 1 tablet (10 mg total) by mouth daily.   melatonin 3 MG TABS tablet TAKE 3 TABLETS/CAPSULES ('9MG'$ ) BY MOUTH AT BEDTIME   metoprolol tartrate (LOPRESSOR) 50 MG tablet Take 50 mg by mouth 2 (two) times daily.   nitroGLYCERIN (NITROSTAT) 0.4 MG SL tablet Place 1 tablet (0.4 mg total) under the tongue every 5 (five) minutes x 3 doses as needed for chest pain.   omeprazole (PRILOSEC OTC) 20 MG tablet Take 20 mg by mouth 2 (two) times daily before a meal.   rOPINIRole (REQUIP) 0.5 MG tablet Take 0.5 mg by mouth at bedtime.   sertraline (ZOLOFT) 100 MG tablet Take 100 mg by mouth at bedtime.   tadalafil (CIALIS) 10 MG tablet Take 10 mg by mouth daily as needed for erectile dysfunction.     Allergies:   Atorvastatin   Social History   Socioeconomic History   Marital status: Divorced    Spouse name: Not on file   Number of children: Not on file   Years of education: Not on file   Highest education level: Not on file  Occupational History   Not on file  Tobacco Use  Smoking status: Never   Smokeless tobacco: Never  Substance and Sexual Activity   Alcohol use: No   Drug use: No   Sexual activity: Not on file  Other Topics Concern   Not on file  Social History Narrative   Not on file   Social Determinants of Health   Financial Resource Strain: Not on file  Food Insecurity: Not on file  Transportation Needs: Not on file  Physical Activity: Not on file  Stress: Not on file  Social Connections: Not on file     Family History: The patient's family history includes Colon cancer in his brother; Hypertension in his mother; Lung cancer in his father; Stroke in his brother and mother.  ROS:   Please see the history of present illness.     All other systems reviewed and are negative.  EKGs/Labs/Other Studies Reviewed:    The following studies were reviewed today: Cardiac Cath 07-19-2017: 1.  Acute inferoposterior MI secondary to critical mid RCA in-stent restenosis/thrombosis, treated successfully with PCI using a 4.0 x 23 mm Sierra DES 2.  Mild diffuse nonobstructive coronary artery disease as detailed above 3.  Mild segmental contraction abnormality the left ventricle with normal LVEDP and preserved LVEF   Recommend: Tirofiban x 4 hours DAPT with ASA and brilinta x 12 months (180 mg loading dose administered in cath lab) Aggressive risk reduction measures Consider discharge in 48 hours if no complications arise  EKG:  EKG is ordered today.  The ekg ordered today demonstrates NSR 63 bpm, within normal limits  Recent Labs: No results found for requested labs within last 365 days.  Recent Lipid Panel    Component Value Date/Time   CHOL 143 07/20/2017 0203   TRIG 107 07/20/2017 0203   HDL 45 07/20/2017 0203   CHOLHDL 3.2 07/20/2017 0203   VLDL 21 07/20/2017 0203   LDLCALC 77 07/20/2017 0203     Risk Assessment/Calculations:                Physical Exam:    VS:  BP 128/82 (BP Location: Right Arm, Patient Position: Sitting, Cuff Size: Normal)   Pulse 97   Ht '6\' 1"'$  (1.854 m)   Wt 195 lb 6.4 oz (88.6 kg)   SpO2 97%   BMI 25.78 kg/m     Wt Readings from Last 3 Encounters:  11/11/22 195 lb 6.4 oz (88.6 kg)  06/19/21 198 lb 12.8 oz (90.2 kg)  06/18/20 205 lb 6.4 oz (93.2 kg)     GEN:  Well nourished, well developed in no acute distress HEENT: Normal NECK: No JVD; No carotid bruits LYMPHATICS: No lymphadenopathy CARDIAC: RRR, no murmurs, rubs, gallops RESPIRATORY:  Clear to auscultation without rales, wheezing or rhonchi  ABDOMEN: Soft, non-tender, non-distended MUSCULOSKELETAL:  No edema; No deformity  SKIN: Warm and dry NEUROLOGIC:  Alert and oriented x 3 PSYCHIATRIC:  Normal affect    ASSESSMENT:    1. Coronary artery disease involving native coronary artery of native heart without angina pectoris   2. Essential hypertension   3. Hyperlipidemia LDL goal <70    PLAN:    In order of problems listed above:  The patient is doing very well with no angina.  He will continue on aspirin for antiplatelet therapy, atorvastatin, lisinopril, and metoprolol.  No changes are made today.  He is advised to seek immediate medical attention if he develops any angina with exertion or at rest.  He carries sublingual nitroglycerin.  All of his questions were  answered.  I will see him back in 1 year unless problems arise in the interim. Blood pressure is well-controlled on lisinopril and metoprolol.  Labs are monitored through the [place] system. Treated with atorvastatin 80 mg daily.  Lipids followed through the [place] system.      Medication Adjustments/Labs and Tests Ordered: Current medicines are reviewed at length with the patient today.  Concerns regarding medicines are outlined above.  No orders of the defined types were placed in this encounter.  No orders of the defined types were placed in this encounter.   Patient Instructions  Medication Instructions:  Your physician recommends that you continue on your current medications as directed. Please refer to the Current Medication list given to you today.  *If you need a refill on your cardiac medications before your next appointment, please call your pharmacy*   Lab Work: NONE If you have labs (blood work) drawn today and your tests are completely normal, you will receive your results only by: [place] (if you have MyChart) OR A paper copy in the mail If you have any lab test that is abnormal or we need to change your treatment, we will call you to review the results.   Testing/Procedures: NONE   Follow-Up: At [facility], you and your health needs are our priority.  As part of our continuing mission to  provide you with exceptional heart care, we have created designated Provider Care Teams.  These Care Teams include your primary Cardiologist (physician) and Advanced Practice Providers (APPs -  Physician Assistants and Nurse Practitioners) who all work together to provide you with the care you need, when you need it.  We recommend signing up for the patient portal called "MyChart".  Sign up information is provided on this After Visit Summary.  MyChart is used to connect with patients for Virtual Visits (Telemedicine).  Patients are able to view lab/test results, encounter notes, upcoming appointments, etc.  Non-urgent messages can be sent to your provider as well.   To learn more about what you can do with MyChart, go to [email or web address].    Your next appointment:   1 year(s)  Provider:   Sherren Mocha, MD        Signed, Sherren Mocha, MD  11/11/2022 9:55 AM    [place]

## 2022-11-11 NOTE — Patient Instructions (Signed)
Medication Instructions:  Your physician recommends that you continue on your current medications as directed. Please refer to the Current Medication list given to you today.  *If you need a refill on your cardiac medications before your next appointment, please call your pharmacy*   Lab Work: NONE If you have labs (blood work) drawn today and your tests are completely normal, you will receive your results only by: [place] (if you have MyChart) OR A paper copy in the mail If you have any lab test that is abnormal or we need to change your treatment, we will call you to review the results.   Testing/Procedures: NONE   Follow-Up: At [facility], you and your health needs are our priority.  As part of our continuing mission to provide you with exceptional heart care, we have created designated Provider Care Teams.  These Care Teams include your primary Cardiologist (physician) and Advanced Practice Providers (APPs -  Physician Assistants and Nurse Practitioners) who all work together to provide you with the care you need, when you need it.  We recommend signing up for the patient portal called "MyChart".  Sign up information is provided on this After Visit Summary.  MyChart is used to connect with patients for Virtual Visits (Telemedicine).  Patients are able to view lab/test results, encounter notes, upcoming appointments, etc.  Non-urgent messages can be sent to your provider as well.   To learn more about what you can do with MyChart, go to [email or web address].    Your next appointment:   1 year(s)  Provider:   Sherren Mocha, MD
